# Patient Record
Sex: Male | Born: 1970 | Race: White | Hispanic: No | Marital: Married | Smoking: Current every day smoker
Health system: Southern US, Community
[De-identification: ages and names within clinical notes are randomized; demographics above are authoritative.]

## PROBLEM LIST (undated history)

## (undated) DIAGNOSIS — J039 Acute tonsillitis, unspecified: Secondary | ICD-10-CM

## (undated) DIAGNOSIS — J020 Streptococcal pharyngitis: Secondary | ICD-10-CM

## (undated) DIAGNOSIS — K219 Gastro-esophageal reflux disease without esophagitis: Secondary | ICD-10-CM

## (undated) HISTORY — PX: APPENDECTOMY: SHX54

## (undated) HISTORY — PX: OTHER SURGICAL HISTORY: SHX169

---

## 2012-02-26 ENCOUNTER — Encounter (HOSPITAL_COMMUNITY): Payer: Self-pay

## 2012-02-26 ENCOUNTER — Emergency Department (HOSPITAL_COMMUNITY)
Admission: EM | Admit: 2012-02-26 | Discharge: 2012-02-26 | Disposition: A | Discharge status: Home / Self Care | Payer: Self-pay | Attending: Emergency Medicine | Admitting: Emergency Medicine

## 2012-02-26 DIAGNOSIS — R599 Enlarged lymph nodes, unspecified: Secondary | ICD-10-CM | POA: Insufficient documentation

## 2012-02-26 DIAGNOSIS — R509 Fever, unspecified: Secondary | ICD-10-CM | POA: Insufficient documentation

## 2012-02-26 DIAGNOSIS — J029 Acute pharyngitis, unspecified: Secondary | ICD-10-CM | POA: Insufficient documentation

## 2012-02-26 DIAGNOSIS — F17200 Nicotine dependence, unspecified, uncomplicated: Secondary | ICD-10-CM | POA: Insufficient documentation

## 2012-02-26 DIAGNOSIS — Z8619 Personal history of other infectious and parasitic diseases: Secondary | ICD-10-CM | POA: Insufficient documentation

## 2012-02-26 DIAGNOSIS — R51 Headache: Secondary | ICD-10-CM | POA: Insufficient documentation

## 2012-02-26 DIAGNOSIS — R000 Tachycardia, unspecified: Secondary | ICD-10-CM | POA: Insufficient documentation

## 2012-02-26 MED ORDER — PENICILLIN V POTASSIUM 250 MG/5ML PO SOLR: 250.0000 mg | Freq: Four times a day (QID) | ORAL | Status: AC

## 2012-02-26 MED ORDER — HYDROCODONE-ACETAMINOPHEN 5-325 MG PO TABS: 1.0000 | ORAL_TABLET | ORAL | Status: DC | PRN

## 2012-02-26 MED ORDER — PENICILLIN V POTASSIUM 250 MG PO TABS: 250.0000 mg | ORAL_TABLET | Freq: Four times a day (QID) | ORAL | Status: DC

## 2012-02-26 NOTE — ED Notes (Signed)
Unable to examine throat well, pt has strong gag reflex and when I attempted to get a strep screen, he could not tolerate it .  Says he has always been that way and could not swallow well. Wants liquid meds.

## 2012-02-26 NOTE — ED Notes (Signed)
Pt reports sore throat for 3 days.

## 2012-02-26 NOTE — ED Notes (Signed)
Alert, says he has had a sore throat for 3 days,

## 2012-02-26 NOTE — ED Provider Notes (Signed)
History     CSN: [id number]  Arrival date & time 02/26/12  1514   First MD Initiated Contact with Patient 02/26/12 1530      Chief Complaint  Patient presents with  . Sore Throat   HPI Steve Gardner is a 42 y.o. male who presents to the ED with a sore throat. The sore throat started 3 days ago. He rates his pain as 8/10.  Associated symptoms include fever, chills, gland swelling and just feeling bad. Denies cough, nausea, vomiting or diarrhea. Has had strep before and felt similar. Friend has strep. The history was provided by the patient.   History reviewed. No pertinent past medical history.  Past Surgical History  Procedure Laterality Date  . Appendectomy      No family history on file.  History  Substance Use Topics  . Smoking status: Current Every Day Smoker  . Smokeless tobacco: Not on file  . Alcohol Use: Yes      Review of Systems  Constitutional: Positive for fever and chills.  HENT: Positive for sore throat. Negative for facial swelling.   Eyes: Negative for redness.  Respiratory: Negative for cough.   Cardiovascular: Negative for chest pain and palpitations.  Gastrointestinal: Negative for nausea, vomiting and abdominal pain.  Genitourinary: Negative for frequency.  Neurological: Positive for headaches. Negative for dizziness.  Psychiatric/Behavioral: Negative for confusion and agitation.    Allergies  Review of patient's allergies indicates not on file.  Home Medications  No current outpatient prescriptions on file.  BP 131/81  Pulse 103  Temp(Src) 98.3 F (36.8 C) (Oral)  Resp 20  Ht 6\' 3"  (1.905 m)  Wt 180 lb (81.647 kg)  BMI 22.5 kg/m2  SpO2 100%  Physical Exam  Nursing note and vitals reviewed. Constitutional: He is oriented to person, place, and time. He appears well-developed and well-nourished. No distress.  HENT:  Head: Normocephalic and atraumatic.  Right Ear: Tympanic membrane normal.  Left Ear: Tympanic membrane normal.  Nose:  Nose normal.  Mouth/Throat: Uvula is midline and mucous membranes are normal. Oropharyngeal exudate and posterior oropharyngeal erythema present.  Eyes: EOM are normal. Pupils are equal, round, and reactive to light.  Neck: Neck supple.  Cardiovascular:  tachycardia  Pulmonary/Chest: Effort normal and breath sounds normal.  Abdominal: Soft. There is no tenderness.  Musculoskeletal: Normal range of motion. He exhibits no edema.  Neurological: He is alert and oriented to person, place, and time. No cranial nerve deficit.  Skin: Skin is warm and dry.  Psychiatric: He has a normal mood and affect. His behavior is normal. Judgment and thought content normal.   Procedures  Assessment: 42 y.o. male with pharyngitis  Plan:  Pen VK   Ibuprofen Discussed with the patient and all questioned fully answered. He will return if any problems arise.    Medication List    TAKE these medications       HYDROcodone-acetaminophen 5-325 MG per tablet  Commonly known as:  NORCO/VICODIN  Take 1 tablet by mouth every 4 (four) hours as needed for pain.     penicillin v potassium 250 MG tablet  Commonly known as:  VEETID  Take 1 tablet (250 mg total) by mouth 4 (four) times daily.            [place], [place] 02/26/12 [phone]

## 2012-03-01 NOTE — ED Provider Notes (Signed)
History/physical exam/procedure(s) were performed by non-physician practitioner and as supervising physician I was immediately available for consultation/collaboration. I have reviewed all notes and am in agreement with care and plan.   Hilario Quarry, MD 03/01/12 1556

## 2012-03-11 ENCOUNTER — Encounter (HOSPITAL_COMMUNITY): Payer: Self-pay | Admitting: *Deleted

## 2012-03-11 ENCOUNTER — Emergency Department (HOSPITAL_COMMUNITY)
Admission: EM | Admit: 2012-03-11 | Discharge: 2012-03-11 | Disposition: A | Discharge status: Home / Self Care | Payer: BC Managed Care – PPO | Attending: Emergency Medicine | Admitting: Emergency Medicine

## 2012-03-11 DIAGNOSIS — R51 Headache: Secondary | ICD-10-CM | POA: Insufficient documentation

## 2012-03-11 DIAGNOSIS — R05 Cough: Secondary | ICD-10-CM | POA: Insufficient documentation

## 2012-03-11 DIAGNOSIS — R599 Enlarged lymph nodes, unspecified: Secondary | ICD-10-CM | POA: Insufficient documentation

## 2012-03-11 DIAGNOSIS — J029 Acute pharyngitis, unspecified: Secondary | ICD-10-CM | POA: Insufficient documentation

## 2012-03-11 DIAGNOSIS — F17200 Nicotine dependence, unspecified, uncomplicated: Secondary | ICD-10-CM | POA: Insufficient documentation

## 2012-03-11 DIAGNOSIS — J3489 Other specified disorders of nose and nasal sinuses: Secondary | ICD-10-CM | POA: Insufficient documentation

## 2012-03-11 DIAGNOSIS — R5381 Other malaise: Secondary | ICD-10-CM | POA: Insufficient documentation

## 2012-03-11 DIAGNOSIS — R059 Cough, unspecified: Secondary | ICD-10-CM | POA: Insufficient documentation

## 2012-03-11 DIAGNOSIS — R5383 Other fatigue: Secondary | ICD-10-CM | POA: Insufficient documentation

## 2012-03-11 DIAGNOSIS — R6889 Other general symptoms and signs: Secondary | ICD-10-CM

## 2012-03-11 LAB — MONONUCLEOSIS SCREEN: Mono Screen: NEGATIVE

## 2012-03-11 MED ORDER — PREDNISONE 10 MG PO TABS: 10.0000 mg | ORAL_TABLET | Freq: Every day | ORAL | Status: DC

## 2012-03-11 MED ORDER — KETOROLAC TROMETHAMINE 30 MG/ML IJ SOLN: 30.0000 mg | Freq: Once | INTRAMUSCULAR | Status: DC

## 2012-03-11 MED ORDER — HYDROCODONE-ACETAMINOPHEN 5-325 MG PO TABS: 1.0000 | ORAL_TABLET | ORAL | Status: DC | PRN

## 2012-03-11 MED ORDER — KETOROLAC TROMETHAMINE 60 MG/2ML IM SOLN
30.0000 mg | Freq: Once | INTRAMUSCULAR | Status: AC
  Administered 2012-03-11: 30 mg via INTRAMUSCULAR
  Filled 2012-03-11: qty 2

## 2012-03-11 MED ORDER — AZITHROMYCIN 200 MG/5ML PO SUSR: ORAL | Status: DC

## 2012-03-11 NOTE — ED Notes (Signed)
Pt with sore throat and generalized weakness x 3 weeks, denies fever, has taken Penicillin but stopped it and sore throat came back and then resumed med

## 2012-03-11 NOTE — ED Notes (Signed)
Pt c/o sore throat, dysphagia, cough and generalized weakness x3 weeks. Pt was seen here last week and given abx for tx. Pt still has symptoms and is here for follow-up.

## 2012-03-11 NOTE — ED Provider Notes (Signed)
History     CSN: [id number]  Arrival date & time 03/11/12  1448   First MD Initiated Contact with Patient 03/11/12 [phone]      Chief Complaint  Patient presents with  . Sore Throat  . Weakness   HPI Steve Gardner is a 42 y.o. male who presents to the ED with sore throat. The sore throat started 3 weeks ago. Associated symptoms include generalized weakness. He denies fever. Was taking Penicillin but stopped it and the sore throat came back. Patient states he gets this every year and always has to get liquid prednisone, liquid antibiotic and liquid pain medication.   History reviewed. No pertinent past medical history.  Past Surgical History  Procedure Laterality Date  . Appendectomy      History reviewed. No pertinent family history.  History  Substance Use Topics  . Smoking status: Current Every Day Smoker  . Smokeless tobacco: Not on file  . Alcohol Use: Yes      Review of Systems  Constitutional: Negative for fever and chills.  HENT: Positive for congestion and sore throat. Negative for neck pain.   Respiratory: Positive for cough.   Cardiovascular: Negative for chest pain.  Gastrointestinal: Negative for nausea, vomiting and abdominal pain.  Musculoskeletal: Negative for back pain.  Skin: Negative for rash and wound.  Allergic/Immunologic: Negative for immunocompromised state.  Neurological: Positive for headaches. Negative for dizziness.  Psychiatric/Behavioral: Negative for confusion. The patient is not nervous/anxious.     Allergies  Review of patient's allergies indicates no known allergies.  Home Medications   Current Outpatient Rx  Name  Route  Sig  Dispense  Refill  . HYDROcodone-acetaminophen (NORCO/VICODIN) 5-325 MG per tablet   Oral   Take 1 tablet by mouth every 4 (four) hours as needed for pain.   10 tablet   0     BP 108/84  Pulse 93  Temp(Src) 98.1 F (36.7 C) (Oral)  Resp 18  Ht 6\' 3"  (1.905 m)  Wt 184 lb (83.462 kg)  BMI 23 kg/m2   SpO2 98%  Physical Exam  Nursing note and vitals reviewed. Constitutional: He is oriented to person, place, and time. He appears well-developed and well-nourished. No distress.  HENT:  Head: Normocephalic and atraumatic.  Right Ear: Tympanic membrane normal.  Left Ear: Tympanic membrane normal.  Nose: Nose normal.  Mouth/Throat: Uvula is midline and mucous membranes are normal. Posterior oropharyngeal erythema present.  Eyes: EOM are normal. Pupils are equal, round, and reactive to light.  Neck: Neck supple.  Cardiovascular: Normal rate and regular rhythm.   Pulmonary/Chest: Effort normal and breath sounds normal.  Abdominal: Soft. There is no tenderness.  Musculoskeletal: Normal range of motion. He exhibits no edema.  Lymphadenopathy:    He has cervical adenopathy.  Neurological: He is alert and oriented to person, place, and time. No cranial nerve deficit.  Skin: Skin is warm and dry.  Psychiatric: He has a normal mood and affect. His behavior is normal. Judgment and thought content normal.   Procedures  Assessment: 42 y.o. male with sore throat Flu like symptoms  Plan: Zithromax  Prednisone Discussed with the patient and all questioned fully answered.    Medication List    TAKE these medications       azithromycin 200 MG/5ML suspension  Commonly known as:  ZITHROMAX  Take 2.5 tsps. By mouth initially and then take 1.5 tsps. Daily x 4 days     HYDROcodone-acetaminophen 5-325 MG per tablet  Commonly  known as:  NORCO/VICODIN  Take 1 tablet by mouth every 4 (four) hours as needed for pain.     predniSONE 10 MG tablet  Commonly known as:  DELTASONE  Take 1 tablet (10 mg total) by mouth daily.      ASK your doctor about these medications       COUGH/COLD MEDICINE PO  Take 15-30 mLs by mouth daily as needed (for symptoms).          Janne Napoleon, NP 03/11/12 [phone]

## 2012-03-11 NOTE — ED Provider Notes (Signed)
Medical screening examination/treatment/procedure(s) were performed by non-physician practitioner and as supervising physician I was immediately available for consultation/collaboration.   Joya Gaskins, MD 03/11/12 [phone]

## 2012-03-31 ENCOUNTER — Emergency Department (HOSPITAL_COMMUNITY): Payer: BC Managed Care – PPO

## 2012-03-31 ENCOUNTER — Emergency Department (HOSPITAL_COMMUNITY)
Admission: EM | Admit: 2012-03-31 | Discharge: 2012-03-31 | Disposition: A | Discharge status: Home / Self Care | Payer: BC Managed Care – PPO | Attending: Emergency Medicine | Admitting: Emergency Medicine

## 2012-03-31 ENCOUNTER — Encounter (HOSPITAL_COMMUNITY): Payer: Self-pay

## 2012-03-31 DIAGNOSIS — R1310 Dysphagia, unspecified: Secondary | ICD-10-CM | POA: Insufficient documentation

## 2012-03-31 DIAGNOSIS — Z8709 Personal history of other diseases of the respiratory system: Secondary | ICD-10-CM | POA: Insufficient documentation

## 2012-03-31 DIAGNOSIS — R6889 Other general symptoms and signs: Secondary | ICD-10-CM | POA: Insufficient documentation

## 2012-03-31 DIAGNOSIS — F1010 Alcohol abuse, uncomplicated: Secondary | ICD-10-CM | POA: Insufficient documentation

## 2012-03-31 DIAGNOSIS — R05 Cough: Secondary | ICD-10-CM | POA: Insufficient documentation

## 2012-03-31 DIAGNOSIS — R059 Cough, unspecified: Secondary | ICD-10-CM | POA: Insufficient documentation

## 2012-03-31 DIAGNOSIS — F17200 Nicotine dependence, unspecified, uncomplicated: Secondary | ICD-10-CM | POA: Insufficient documentation

## 2012-03-31 DIAGNOSIS — J029 Acute pharyngitis, unspecified: Secondary | ICD-10-CM | POA: Insufficient documentation

## 2012-03-31 HISTORY — DX: Acute tonsillitis, unspecified: J03.90

## 2012-03-31 HISTORY — DX: Streptococcal pharyngitis: J02.0

## 2012-03-31 LAB — CBC WITH DIFFERENTIAL/PLATELET
Basophils Absolute: 0 10*3/uL (ref 0.0–0.1)
Basophils Relative: 0 % (ref 0–1)
Hemoglobin: 16.4 g/dL (ref 13.0–17.0)
MCHC: 35 g/dL (ref 30.0–36.0)
Neutro Abs: 8.6 10*3/uL — ABNORMAL HIGH (ref 1.7–7.7)
Neutrophils Relative %: 73 % (ref 43–77)
Platelets: 213 10*3/uL (ref 150–400)
RDW: 13.4 % (ref 11.5–15.5)

## 2012-03-31 LAB — BASIC METABOLIC PANEL
Chloride: 95 mEq/L — ABNORMAL LOW (ref 96–112)
GFR calc Af Amer: >90 mL/min (ref >90)
Potassium: 4 mEq/L (ref 3.5–5.1)

## 2012-03-31 MED ORDER — HYDROCODONE-ACETAMINOPHEN 7.5-325 MG/15ML PO SOLN
ORAL | Status: AC
  Administered 2012-03-31: 10 mL via ORAL
  Filled 2012-03-31: qty 15

## 2012-03-31 MED ORDER — LORAZEPAM 2 MG/ML IJ SOLN: 1.0000 mg | Freq: Once | INTRAMUSCULAR | Status: DC

## 2012-03-31 MED ORDER — LORAZEPAM 1 MG PO TABS
1.0000 mg | ORAL_TABLET | Freq: Once | ORAL | Status: AC
  Administered 2012-03-31: 1 mg via ORAL
  Filled 2012-03-31: qty 1

## 2012-03-31 MED ORDER — HYDROCODONE-ACETAMINOPHEN 7.5-500 MG/15ML PO SOLN
10.0000 mL | Freq: Four times a day (QID) | ORAL | Status: DC | PRN
Start: 2012-03-31 — End: 2012-07-15

## 2012-03-31 NOTE — ED Notes (Signed)
Patient complaining of sore throat. Has a history of tonsillitis and strep per family. Has been here several times during the past few weeks for the same.

## 2012-03-31 NOTE — ED Notes (Signed)
Sore throat for over 1 week.  Seen here for same.  Alert, Says he cannot swallow pills,  But has drank 2 beers today.

## 2012-04-01 ENCOUNTER — Ambulatory Visit (HOSPITAL_COMMUNITY)
Admission: RE | Admit: 2012-04-01 | Discharge: 2012-04-01 | Disposition: A | Discharge status: Home / Self Care | Payer: BC Managed Care – PPO | Source: Ambulatory Visit | Attending: Emergency Medicine | Admitting: Emergency Medicine

## 2012-04-01 ENCOUNTER — Ambulatory Visit (HOSPITAL_COMMUNITY): Payer: Self-pay

## 2012-04-01 DIAGNOSIS — J029 Acute pharyngitis, unspecified: Secondary | ICD-10-CM | POA: Insufficient documentation

## 2012-04-01 DIAGNOSIS — R220 Localized swelling, mass and lump, head: Secondary | ICD-10-CM | POA: Insufficient documentation

## 2012-04-01 MED ORDER — IOHEXOL 300 MG/ML  SOLN
75.0000 mL | Freq: Once | INTRAMUSCULAR | Status: AC | PRN
  Administered 2012-04-01: 75 mL via INTRAVENOUS

## 2012-04-03 NOTE — ED Provider Notes (Signed)
Medical screening examination/treatment/procedure(s) were performed by non-physician practitioner and as supervising physician I was immediately available for consultation/collaboration.   Shelda Jakes, MD 04/03/12 [phone]

## 2012-04-03 NOTE — ED Provider Notes (Signed)
History     CSN: [id number]  Arrival date & time 03/31/12  1924   First MD Initiated Contact with Patient 03/31/12 1937      Chief Complaint  Patient presents with  . Sore Throat    (Consider location/radiation/quality/duration/timing/severity/associated sxs/prior treatment) HPI Comments: Steve Gardner is a 42 y.o. Male presenting with a now more than 1 month history of sore throat and sensation of foreign body in his lower throat (points to his cricoid area).  This is his third visit here since 02/26/12 for this complaint and has had no relief despite 2 rounds of antibiotics including amoxil and zithromax.  The soreness and swelling has worsened to where he cannot swallow pills. He is able to eat and drink although has been careful to eat only soft foods.  His wife has noticed he constantly clears his throat.  He has had some mild cough, but states not worsened then his normal smokers cough.  He denies wheezing, shortness of breath and has had no fevers or chills.  Additionally denies nasal congestion or post nasal drip. He does drink etoh most days according to wife, with him replying it is to stop the pain in his throat.     The history is provided by the patient and the spouse.    Past Medical History  Diagnosis Date  . Strep throat   . Tonsillitis     Past Surgical History  Procedure Laterality Date  . Appendectomy      No family history on file.  History  Substance Use Topics  . Smoking status: Current Every Day Smoker  . Smokeless tobacco: Not on file  . Alcohol Use: Yes      Review of Systems  Constitutional: Negative for fever and chills.  HENT: Positive for sore throat and trouble swallowing. Negative for nosebleeds, congestion, rhinorrhea, neck pain, voice change, postnasal drip and sinus pressure.   Eyes: Negative.   Respiratory: Positive for cough. Negative for choking, chest tightness, shortness of breath, wheezing and stridor.   Cardiovascular: Negative  for chest pain.  Gastrointestinal: Negative for nausea and abdominal pain.  Genitourinary: Negative.   Musculoskeletal: Negative for joint swelling and arthralgias.  Skin: Negative.  Negative for rash and wound.  Neurological: Negative for dizziness, weakness, light-headedness, numbness and headaches.  Psychiatric/Behavioral: Negative.     Allergies  Review of patient's allergies indicates no known allergies.  Home Medications   Current Outpatient Rx  Name  Route  Sig  Dispense  Refill  . HYDROcodone-acetaminophen (LORTAB) 7.5-500 MG/15ML solution   Oral   Take 10 mLs by mouth every 6 (six) hours as needed for pain.   120 mL   0     BP 130/80  Pulse 94  Temp(Src) 98.1 F (36.7 C) (Oral)  Resp 18  Ht 6\' 3"  (1.905 m)  Wt 175 lb (79.379 kg)  BMI 21.87 kg/m2  SpO2 98%  Physical Exam  Nursing note and vitals reviewed. Constitutional: He appears well-developed and well-nourished.  HENT:  Head: Normocephalic and atraumatic.  Eyes: Conjunctivae are normal.  Neck: Normal range of motion.  Cardiovascular: Normal rate, regular rhythm, normal heart sounds and intact distal pulses.   Pulmonary/Chest: Effort normal and breath sounds normal. No respiratory distress. He has no decreased breath sounds. He has no wheezes. He has no rhonchi.  Abdominal: Soft. Bowel sounds are normal. There is no tenderness.  Musculoskeletal: Normal range of motion.  Neurological: He is alert.  Skin: Skin is warm and  dry.  Psychiatric: He has a normal mood and affect.    ED Course  Procedures (including critical care time)  Labs Reviewed  CBC WITH DIFFERENTIAL - Abnormal; Notable for the following:    WBC 11.8 (*)    Neutro Abs 8.6 (*)    All other components within normal limits  BASIC METABOLIC PANEL - Abnormal; Notable for the following:    Sodium 133 (*)    Chloride 95 (*)    All other components within normal limits   Ct Soft Tissue Neck W Contrast  04/01/2012  *RADIOLOGY REPORT*   Clinical Data: Pain and swelling.  Sore throat.  CT NECK WITH CONTRAST  Technique:  Multidetector CT imaging of the neck was performed with intravenous contrast.  Contrast: 75mL OMNIPAQUE IOHEXOL 300 MG/ML  SOLN  Comparison: None.  Findings: The visualized brainstem and cerebellum are grossly normal.  The skull base is normal.  Scattered ethmoid sinus disease and small mucous retention cysts or polyps in the maxillary sinuses.  The mastoid air cells and middle ear cavities are clear.  The parotid and submandibular glands are symmetric and normal.  No inflammatory changes or mass lesions.  The tongue base and floor the mouth are normal.  The epiglottis is normal and the aryepiglottic folds are normal.  Slightly prominent lymphoid tissue at the base the tongue and mildly prominent tonsils but no findings for significant tonsillar inflammation/edema and no abscess.  The piriform sinus and vallecular air spaces are normal.  The paraglottic fat planes are maintained.  The thyroid gland is normal.  The major vascular structures are normal.  There are small scattered neck nodes bilaterally but no mass or adenopathy.  The upper esophagus is unremarkable.  No supraclavicular or superior mediastinal lymphadenopathy.  The lung apices are clear.  IMPRESSION: Unremarkable CT examination of the neck.  No findings for significant inflammatory process, abscess, mass or adenopathy.   Original Report Authenticated By: Rudie Meyer, M.D.      1. Pharyngitis       MDM  Pt with third visit here with complaint of fb or swelling sensation in throat without improvement with abx.  During the visit,  CT was not available,  So plain film soft tissue neck completed with subglottic narrowing  But patent airway. Pt was scheduled for outpatient CT neck to further define sx and nonspecific finding on plain film  He was given lortab elixer in ed which helped his sx.  He was given prescription for this.     03/31/12 - pt's Ct results  were reviewed with him and reassurance given with normal findings.  He was encouraged to stop smoking.  Encouraged to f/u with pcp if sx do not resolve.  Also suggested warm tea,  Throat lozenges, etc.      Burgess Amor, PA-C 04/03/12 0121

## 2012-07-15 ENCOUNTER — Emergency Department (HOSPITAL_COMMUNITY)
Admission: EM | Admit: 2012-07-15 | Discharge: 2012-07-15 | Disposition: A | Discharge status: Home / Self Care | Payer: BC Managed Care – PPO | Attending: Emergency Medicine | Admitting: Emergency Medicine

## 2012-07-15 ENCOUNTER — Encounter (HOSPITAL_COMMUNITY): Payer: Self-pay

## 2012-07-15 DIAGNOSIS — K219 Gastro-esophageal reflux disease without esophagitis: Secondary | ICD-10-CM | POA: Insufficient documentation

## 2012-07-15 DIAGNOSIS — Z8619 Personal history of other infectious and parasitic diseases: Secondary | ICD-10-CM | POA: Insufficient documentation

## 2012-07-15 DIAGNOSIS — F17200 Nicotine dependence, unspecified, uncomplicated: Secondary | ICD-10-CM | POA: Insufficient documentation

## 2012-07-15 DIAGNOSIS — M79644 Pain in right finger(s): Secondary | ICD-10-CM

## 2012-07-15 DIAGNOSIS — R609 Edema, unspecified: Secondary | ICD-10-CM | POA: Insufficient documentation

## 2012-07-15 DIAGNOSIS — Z8709 Personal history of other diseases of the respiratory system: Secondary | ICD-10-CM | POA: Insufficient documentation

## 2012-07-15 DIAGNOSIS — M79609 Pain in unspecified limb: Secondary | ICD-10-CM | POA: Insufficient documentation

## 2012-07-15 DIAGNOSIS — Z79899 Other long term (current) drug therapy: Secondary | ICD-10-CM | POA: Insufficient documentation

## 2012-07-15 HISTORY — DX: Gastro-esophageal reflux disease without esophagitis: K21.9

## 2012-07-15 MED ORDER — HYDROCODONE-ACETAMINOPHEN 5-325 MG PO TABS
1.0000 | ORAL_TABLET | Freq: Once | ORAL | Status: AC
  Administered 2012-07-15: 1 via ORAL
  Filled 2012-07-15: qty 1

## 2012-07-15 MED ORDER — HYDROCODONE-ACETAMINOPHEN 5-325 MG PO TABS: ORAL_TABLET | ORAL | Status: DC

## 2012-07-15 NOTE — ED Notes (Signed)
Instructions, prescriptions and f/u information given/reviewed - verbalizes understanding.  

## 2012-07-15 NOTE — ED Notes (Signed)
Old injury, (2009), has recurrent swelling on thumb pad. "needs draining" per pt.

## 2012-07-15 NOTE — ED Provider Notes (Signed)
History    CSN: [id number] Arrival date & time 07/15/12  1900  First MD Initiated Contact with Patient 07/15/12 2112     Chief Complaint  Patient presents with  . Finger Injury   (Consider location/radiation/quality/duration/timing/severity/associated sxs/prior Treatment) HPI Comments: Steve Gardner is a 42 y.o. male who presents to the Emergency Department complaining of pain and swelling to the distal right thumb.  States that he had a tendon injury to his thumb in 2009 and had a swollen area develop to the distal thumb that was drained.  Comes to the ED requesting pain control until his orthopedic appt in two days.  He states sx's are same as previous and this is a re-occurring problem.  He denies redness , pain or numbness of the distal thumb.    Patient is a 42 y.o. male presenting with hand pain. The history is provided by the patient.  Hand Pain This is a recurrent problem. The current episode started 1 to 4 weeks ago. The problem occurs constantly. The problem has been gradually worsening. Associated symptoms include arthralgias and joint swelling. Pertinent negatives include no chest pain, chills, fever, myalgias, neck pain, numbness, rash, swollen glands or vomiting. Exacerbated by: movement and plaption. He has tried nothing for the symptoms. The treatment provided no relief.   Past Medical History  Diagnosis Date  . Strep throat   . Tonsillitis   . GERD (gastroesophageal reflux disease)     has "lpr"   Past Surgical History  Procedure Laterality Date  . Appendectomy     History reviewed. No pertinent family history. History  Substance Use Topics  . Smoking status: Current Every Day Smoker -- 0.50 packs/day  . Smokeless tobacco: Not on file  . Alcohol Use: Yes    Review of Systems  Constitutional: Negative for fever and chills.  HENT: Negative for neck pain.   Cardiovascular: Negative for chest pain.  Gastrointestinal: Negative for vomiting.  Genitourinary:  Negative for dysuria and difficulty urinating.  Musculoskeletal: Positive for joint swelling and arthralgias. Negative for myalgias.  Skin: Negative for color change, rash and wound.  Neurological: Negative for numbness.  All other systems reviewed and are negative.    Allergies  Review of patient's allergies indicates no known allergies.  Home Medications   Current Outpatient Rx  Name  Route  Sig  Dispense  Refill  . esomeprazole (NEXIUM) 40 MG capsule   Oral   Take 40 mg by mouth daily before breakfast.          BP 111/76  Pulse 72  Temp(Src) 97.7 F (36.5 C) (Oral)  Resp 18  Ht 6\' 2"  (1.88 m)  Wt 190 lb (86.183 kg)  BMI 24.38 kg/m2  SpO2 100% Physical Exam  Nursing note and vitals reviewed. Constitutional: He is oriented to person, place, and time. He appears well-developed and well-nourished. No distress.  HENT:  Head: Normocephalic and atraumatic.  Cardiovascular: Normal rate, regular rhythm and normal heart sounds.   No murmur heard. Pulmonary/Chest: Effort normal and breath sounds normal. No respiratory distress.  Musculoskeletal: He exhibits edema and tenderness.       Right hand: He exhibits tenderness and swelling. He exhibits normal range of motion, no bony tenderness, normal two-point discrimination, normal capillary refill, no deformity and no laceration. Normal sensation noted. Normal strength noted.       Hands: Palpable flesh colored nodule to the distal right thumb.  No erythema, excessive warmth,  Distal sensation intact,  Radial pulse  brisk.  No felon present.  Nail appears nml  Neurological: He is alert and oriented to person, place, and time. He exhibits normal muscle tone. Coordination normal.  Skin: Skin is warm. There is erythema.    ED Course  Procedures (including critical care time) Labs Reviewed - No data to display   MDM    Patient has  Palpable mass to the distal right thumb just above the DIP joint.  No erythema, severe tenderness,  excessive warmth or swelling of the thumb pad.  No felon.  Hx of same.  Likely ganglion vs seroma.  Pt has appt with orthopedist in 2 days.  requesting pain control until then  Steve Gardner L. Trisha Mangle, PA-C 07/18/12 2059

## 2012-07-19 NOTE — ED Provider Notes (Signed)
Medical screening examination/treatment/procedure(s) were performed by non-physician practitioner and as supervising physician I was immediately available for consultation/collaboration.   Johanan Skorupski L Honi Name, MD 07/19/12 1532 

## 2012-07-22 ENCOUNTER — Other Ambulatory Visit: Payer: Self-pay | Admitting: Radiology

## 2012-08-08 ENCOUNTER — Encounter (HOSPITAL_COMMUNITY): Payer: Self-pay

## 2012-08-12 NOTE — Patient Instructions (Addendum)
Steve Gardner  08/12/2012   Your procedure is scheduled on:   08/18/2012   Report to [facility] at   615  AM.  Call this number if you have problems the morning of surgery: [phone]   Remember:   Do not eat food or drink liquids after midnight.   Take these medicines the morning of surgery with A SIP OF WATER:  Norco, nexium   Do not wear jewelry, make-up or nail polish.  Do not wear lotions, powders, or perfumes.   Do not shave 48 hours prior to surgery. Men may shave face and neck.  Do not bring valuables to the hospital.  [facility] is not responsible for any belongings or valuables.  Contacts, dentures or bridgework may not be worn into surgery.  Leave suitcase in the car. After surgery it may be brought to your room.  For patients admitted to the hospital, checkout time is 11:00 AM the day of discharge.   Patients discharged the day of surgery will not be allowed to drive home.  Name and phone number of your driver: family  Special Instructions: Shower using CHG 2 nights before surgery and the night before surgery.  If you shower the day of surgery use CHG.  Use special wash - you have one bottle of CHG for all showers.  You should use approximately 1/3 of the bottle for each shower.   Please read over the following fact sheets that you were given: Pain Booklet, Coughing and Deep Breathing, Surgical Site Infection Prevention, Anesthesia Post-op Instructions and Care and Recovery After Surgery Excision of Skin Lesions Excision of a skin lesion refers to the removal of a section of skin by making small cuts (incisions) in the skin. This is typically done to remove a cancerous growth (basal cell carcinoma, squamous cell carcinoma, or melanoma) or a noncancerous growth (cyst). It may be done to treat or prevent cancer or infection. It may also be done to improve cosmetic appearance (removal of mole, skin tag). LET YOUR CAREGIVER KNOW ABOUT:   Allergies to food or  medicine.  Medicines taken, including vitamins, herbs, eyedrops, over-the-counter medicines, and creams.  Use of steroids (by mouth or creams).  Previous problems with anesthetics or numbing medicines.  History of bleeding problems or blood clots.  History of any prostheses.  Previous surgery.  Other health problems, including diabetes and kidney problems.  Possibility of pregnancy, if this applies. RISKS AND COMPLICATIONS  Many complications can be managed. With appropriate treatment and rehabilitation, the following complications are very uncommon:  Bleeding.  Infection.  Scarring.  Recurrence of cyst or cancer.  Changes in skin sensation or appearance (discoloration, swelling).  Reaction to anesthesia.  Allergic reaction to surgical materials or ointments.  Damage to nerves, blood vessels, muscles, or other structures.  Continued pain. BEFORE THE PROCEDURE  It is important to follow your caregiver's instructions prior to your procedure to avoid complications. Steps before your procedure may include:  Physical exam, blood tests, other procedures, such as removing a small sample for examination under a microscope (biopsy).  Your caregiver may review the procedure, the anesthesia being used, and what to expect after the procedure with you. You may be asked to:  Stop taking certain medicines, such as blood thinners (including aspirin, clopidogrel, ibuprofen), for several days prior to your procedure.  Take certain medicines.  Stop smoking. It is a good idea to arrange for a ride home after surgery and to have  someone to help you with activities during recovery. PROCEDURE  There are several excision techniques. The type of excision or surgical technique used will depend on your condition, the location of the lesion, and your overall health. After the lesion is sterilized and a local anesthetic is applied, the following may be performed: Complete surgical  excision The area to be removed is marked with a pen. Using a small scalpel and scissors, the surgeon gently cuts around and under the lesion until it is completely removed. The lesion is placed in a special fluid and sent to the lab for examination. If necessary, bleeding will be controlled with a device that delivers heat. The edges of the wound are stitched together and a dressing is applied. This procedure may be performed to treat a cancerous growth or noncancerous cyst or lesion. Surgeons commonly perform an elliptical excision, to minimize scarring. Excision of a cyst The surgeon makes an incision on the cyst. The entire cyst is removed through the incision. The wound may be closed with a suture (stitch). Shave excision During shave excision, the surgeon uses a small blade or loop instrument to shave off the lesion. This may be done to remove a mole or skin tag. The wound is usually left to heal on its own without stitches. Punch excision During punch excision, the surgeon uses a small, round tool (like a cookie cutter) to cut a circle shape out of the skin. The outer edges of the skin are stitched together. This may be done to remove a mole or scar or to perform a biopsy of the lesion. Mohs micrographic surgery During Mohs micrographic surgery, layers of the lesion are removed with a scalpel or loop instrument and immediately examined under a microscope until all of the abnormal or cancerous tissue is removed. This procedure is minimally invasive and ensures the best cosmetic outcome, with removal of as little normal tissue as possible. Mohs is usually done to treat skin cancer, such as basal cell carcinoma or squamous cell carcinoma, particularly on the face and ears. Antibiotic ointment is applied to the surgical area after each of the procedures listed above, as necessary. AFTER THE PROCEDURE  How well you heal depends on many factors. Most patients heal quite well with proper techniques and  self-care. Scarring will lessen over time. HOME CARE INSTRUCTIONS   Take medicines for pain as directed.  Keep the incision area clean, dry, and protected for at least 48 hours. Change dressings as directed.  For bleeding, apply gentle but firm pressure to the wound using a folded towel for 20 minutes. Call your caregiver if bleeding does not stop.  Avoid high-impact exercise and activities until the stitches are removed or the area heals.  Follow your caregiver's instructions to minimize scarring. Avoid sun exposure until the area has healed. Scarring should lessen over time.  Follow up with your caregiver as directed. Removal of stitches within 4 to 14 days may be necessary. Finding out the results of your test Not all test results are available during your visit. If your test results are not back during the visit, make an appointment with your caregiver to find out the results. Do not assume everything is normal if you have not heard from your caregiver or the medical facility. It is important for you to follow up on all of your test results. SEEK MEDICAL CARE IF:   You or your child has an oral temperature above 102 F (38.9 C).  You develop signs of  infection (chills, feeling unwell).  You notice bleeding, pain, discharge, redness, or swelling at the incision site.  You notice skin irregularities or changes in sensation. MAKE SURE YOU:   Understand these instructions.  Will watch your condition.  Will get help right away if you are not doing well or get worse. FOR MORE INFORMATION  American Academy of Family Physicians: www.https://powers.com/ American Academy of Dermatology: InfoExam.si Document Released: 03/21/2009 Document Revised: 03/19/2011 Document Reviewed: 03/21/2009 [facility] Patient Information 2014 [place], [place]. PATIENT INSTRUCTIONS POST-ANESTHESIA  IMMEDIATELY FOLLOWING SURGERY:  Do not drive or operate machinery for the first twenty four hours after surgery.  Do not  make any important decisions for twenty four hours after surgery or while taking narcotic pain medications or sedatives.  If you develop intractable nausea and vomiting or a severe headache please notify your doctor immediately.  FOLLOW-UP:  Please make an appointment with your surgeon as instructed. You do not need to follow up with anesthesia unless specifically instructed to do so.  WOUND CARE INSTRUCTIONS (if applicable):  Keep a dry clean dressing on the anesthesia/puncture wound site if there is drainage.  Once the wound has quit draining you may leave it open to air.  Generally you should leave the bandage intact for twenty four hours unless there is drainage.  If the epidural site drains for more than 36-48 hours please call the anesthesia department.  QUESTIONS?:  Please feel free to call your physician or the hospital operator if you have any questions, and they will be happy to assist you.

## 2012-08-13 NOTE — Patient Instructions (Signed)
Steve Gardner  08/13/2012   Your procedure is scheduled on:  08/19/12  Report to [facility] at [place] AM.  Call this number if you have problems the morning of surgery: [phone]   Remember:   Do not eat food or drink liquids after midnight.   Take these medicines the morning of surgery with A SIP OF WATER: vicodin, nexium   Do not wear jewelry, make-up or nail polish.  Do not wear lotions, powders, or perfumes. You may wear deodorant.  Do not shave 48 hours prior to surgery. Men may shave face and neck.  Do not bring valuables to the hospital.  [facility] is not responsible                   for any belongings or valuables.  Contacts, dentures or bridgework may not be worn into surgery.  Leave suitcase in the car. After surgery it may be brought to your room.  For patients admitted to the hospital, checkout time is 11:00 AM the day of  discharge.   Patients discharged the day of surgery will not be allowed to drive  home.  Name and phone number of your driver: family  Special Instructions: Shower using CHG 2 nights before surgery and the night before surgery.  If you shower the day of surgery use CHG.  Use special wash - you have one bottle of CHG for all showers.  You should use approximately 1/3 of the bottle for each shower.   Please read over the following fact sheets that you were given: Pain Booklet, Anesthesia Post-op Instructions and Care and Recovery After Surgery   PATIENT INSTRUCTIONS POST-ANESTHESIA  IMMEDIATELY FOLLOWING SURGERY:  Do not drive or operate machinery for the first twenty four hours after surgery.  Do not make any important decisions for twenty four hours after surgery or while taking narcotic pain medications or sedatives.  If you develop intractable nausea and vomiting or a severe headache please notify your doctor immediately.  FOLLOW-UP:  Please make an appointment with your surgeon as instructed. You do not need to follow up with anesthesia unless  specifically instructed to do so.  WOUND CARE INSTRUCTIONS (if applicable):  Keep a dry clean dressing on the anesthesia/puncture wound site if there is drainage.  Once the wound has quit draining you may leave it open to air.  Generally you should leave the bandage intact for twenty four hours unless there is drainage.  If the epidural site drains for more than 36-48 hours please call the anesthesia department.  QUESTIONS?:  Please feel free to call your physician or the hospital operator if you have any questions, and they will be happy to assist you.      Excision of Skin Lesions Excision of a skin lesion refers to the removal of a section of skin by making small cuts (incisions) in the skin. This is typically done to remove a cancerous growth (basal cell carcinoma, squamous cell carcinoma, or melanoma) or a noncancerous growth (cyst). It may be done to treat or prevent cancer or infection. It may also be done to improve cosmetic appearance (removal of mole, skin tag). LET YOUR CAREGIVER KNOW ABOUT:   Allergies to food or medicine.  Medicines taken, including vitamins, herbs, eyedrops, over-the-counter medicines, and creams.  Use of steroids (by mouth or creams).  Previous problems with anesthetics or numbing medicines.  History of bleeding problems or blood clots.  History of any prostheses.  Previous surgery.  Other health problems, including diabetes and kidney problems.  Possibility of pregnancy, if this applies. RISKS AND COMPLICATIONS  Many complications can be managed. With appropriate treatment and rehabilitation, the following complications are very uncommon:  Bleeding.  Infection.  Scarring.  Recurrence of cyst or cancer.  Changes in skin sensation or appearance (discoloration, swelling).  Reaction to anesthesia.  Allergic reaction to surgical materials or ointments.  Damage to nerves, blood vessels, muscles, or other structures.  Continued pain. BEFORE  THE PROCEDURE  It is important to follow your caregiver's instructions prior to your procedure to avoid complications. Steps before your procedure may include:  Physical exam, blood tests, other procedures, such as removing a small sample for examination under a microscope (biopsy).  Your caregiver may review the procedure, the anesthesia being used, and what to expect after the procedure with you. You may be asked to:  Stop taking certain medicines, such as blood thinners (including aspirin, clopidogrel, ibuprofen), for several days prior to your procedure.  Take certain medicines.  Stop smoking. It is a good idea to arrange for a ride home after surgery and to have someone to help you with activities during recovery. PROCEDURE  There are several excision techniques. The type of excision or surgical technique used will depend on your condition, the location of the lesion, and your overall health. After the lesion is sterilized and a local anesthetic is applied, the following may be performed: Complete surgical excision The area to be removed is marked with a pen. Using a small scalpel and scissors, the surgeon gently cuts around and under the lesion until it is completely removed. The lesion is placed in a special fluid and sent to the lab for examination. If necessary, bleeding will be controlled with a device that delivers heat. The edges of the wound are stitched together and a dressing is applied. This procedure may be performed to treat a cancerous growth or noncancerous cyst or lesion. Surgeons commonly perform an elliptical excision, to minimize scarring. Excision of a cyst The surgeon makes an incision on the cyst. The entire cyst is removed through the incision. The wound may be closed with a suture (stitch). Shave excision During shave excision, the surgeon uses a small blade or loop instrument to shave off the lesion. This may be done to remove a mole or skin tag. The wound is  usually left to heal on its own without stitches. Punch excision During punch excision, the surgeon uses a small, round tool (like a cookie cutter) to cut a circle shape out of the skin. The outer edges of the skin are stitched together. This may be done to remove a mole or scar or to perform a biopsy of the lesion. Mohs micrographic surgery During Mohs micrographic surgery, layers of the lesion are removed with a scalpel or loop instrument and immediately examined under a microscope until all of the abnormal or cancerous tissue is removed. This procedure is minimally invasive and ensures the best cosmetic outcome, with removal of as little normal tissue as possible. Mohs is usually done to treat skin cancer, such as basal cell carcinoma or squamous cell carcinoma, particularly on the face and ears. Antibiotic ointment is applied to the surgical area after each of the procedures listed above, as necessary. AFTER THE PROCEDURE  How well you heal depends on many factors. Most patients heal quite well with proper techniques and self-care. Scarring will lessen over time. HOME CARE INSTRUCTIONS   Take medicines for pain as  directed.  Keep the incision area clean, dry, and protected for at least 48 hours. Change dressings as directed.  For bleeding, apply gentle but firm pressure to the wound using a folded towel for 20 minutes. Call your caregiver if bleeding does not stop.  Avoid high-impact exercise and activities until the stitches are removed or the area heals.  Follow your caregiver's instructions to minimize scarring. Avoid sun exposure until the area has healed. Scarring should lessen over time.  Follow up with your caregiver as directed. Removal of stitches within 4 to 14 days may be necessary. Finding out the results of your test Not all test results are available during your visit. If your test results are not back during the visit, make an appointment with your caregiver to find out the  results. Do not assume everything is normal if you have not heard from your caregiver or the medical facility. It is important for you to follow up on all of your test results. SEEK MEDICAL CARE IF:   You or your child has an oral temperature above 102 F (38.9 C).  You develop signs of infection (chills, feeling unwell).  You notice bleeding, pain, discharge, redness, or swelling at the incision site.  You notice skin irregularities or changes in sensation. MAKE SURE YOU:   Understand these instructions.  Will watch your condition.  Will get help right away if you are not doing well or get worse. FOR MORE INFORMATION  American Academy of Family Physicians: www.https://powers.com/ American Academy of Dermatology: InfoExam.si Document Released: 03/21/2009 Document Revised: 03/19/2011 Document Reviewed: 03/21/2009 [facility] Patient Information 2014 [place], [place].

## 2012-08-14 ENCOUNTER — Encounter (HOSPITAL_COMMUNITY)
Admission: RE | Admit: 2012-08-14 | Discharge: 2012-08-14 | Disposition: A | Discharge status: Home / Self Care | Payer: BC Managed Care – PPO | Source: Ambulatory Visit | Attending: Orthopaedic Surgery | Admitting: Orthopaedic Surgery

## 2012-08-14 ENCOUNTER — Emergency Department (HOSPITAL_COMMUNITY)
Admission: EM | Admit: 2012-08-14 | Discharge: 2012-08-14 | Disposition: A | Discharge status: Home / Self Care | Payer: BC Managed Care – PPO | Attending: Emergency Medicine | Admitting: Emergency Medicine

## 2012-08-14 ENCOUNTER — Encounter (HOSPITAL_COMMUNITY): Payer: Self-pay

## 2012-08-14 ENCOUNTER — Encounter (HOSPITAL_COMMUNITY): Payer: Self-pay | Admitting: Emergency Medicine

## 2012-08-14 DIAGNOSIS — L989 Disorder of the skin and subcutaneous tissue, unspecified: Secondary | ICD-10-CM | POA: Insufficient documentation

## 2012-08-14 DIAGNOSIS — Z8709 Personal history of other diseases of the respiratory system: Secondary | ICD-10-CM | POA: Insufficient documentation

## 2012-08-14 DIAGNOSIS — K219 Gastro-esophageal reflux disease without esophagitis: Secondary | ICD-10-CM | POA: Insufficient documentation

## 2012-08-14 DIAGNOSIS — Z01812 Encounter for preprocedural laboratory examination: Secondary | ICD-10-CM | POA: Insufficient documentation

## 2012-08-14 DIAGNOSIS — M25549 Pain in joints of unspecified hand: Secondary | ICD-10-CM | POA: Insufficient documentation

## 2012-08-14 DIAGNOSIS — M6740 Ganglion, unspecified site: Secondary | ICD-10-CM

## 2012-08-14 DIAGNOSIS — Z79899 Other long term (current) drug therapy: Secondary | ICD-10-CM | POA: Insufficient documentation

## 2012-08-14 DIAGNOSIS — F17200 Nicotine dependence, unspecified, uncomplicated: Secondary | ICD-10-CM | POA: Insufficient documentation

## 2012-08-14 LAB — URINALYSIS, ROUTINE W REFLEX MICROSCOPIC
Bilirubin Urine: NEGATIVE
Leukocytes, UA: NEGATIVE
Nitrite: NEGATIVE
Specific Gravity, Urine: >1.03 — ABNORMAL HIGH (ref 1.005–1.030)
Urobilinogen, UA: 0.2 mg/dL (ref 0.0–1.0)
pH: 6 (ref 5.0–8.0)

## 2012-08-14 LAB — COMPREHENSIVE METABOLIC PANEL
ALT: 35 U/L (ref 0–53)
Calcium: 9.9 mg/dL (ref 8.4–10.5)
Creatinine, Ser: 0.8 mg/dL (ref 0.50–1.35)
GFR calc Af Amer: >90 mL/min (ref >90)
GFR calc non Af Amer: >90 mL/min (ref >90)
Glucose, Bld: 126 mg/dL — ABNORMAL HIGH (ref 70–99)
Sodium: 136 mEq/L (ref 135–145)
Total Protein: 7.4 g/dL (ref 6.0–8.3)

## 2012-08-14 LAB — CBC WITH DIFFERENTIAL/PLATELET
Eosinophils Absolute: 0.2 10*3/uL (ref 0.0–0.7)
Eosinophils Relative: 3 % (ref 0–5)
HCT: 49.8 % (ref 39.0–52.0)
Lymphs Abs: 2.8 10*3/uL (ref 0.7–4.0)
MCH: 32 pg (ref 26.0–34.0)
MCV: 92.7 fL (ref 78.0–100.0)
Monocytes Absolute: 0.6 10*3/uL (ref 0.1–1.0)
Platelets: 224 10*3/uL (ref 150–400)
RBC: 5.37 MIL/uL (ref 4.22–5.81)
RDW: 12.9 % (ref 11.5–15.5)

## 2012-08-14 MED ORDER — OXYCODONE-ACETAMINOPHEN 5-325 MG PO TABS: 1.0000 | ORAL_TABLET | ORAL | Status: DC | PRN

## 2012-08-14 NOTE — ED Notes (Signed)
Pt of Dr. Jenetta Downer. Scheduled to have surgery on his thumb on Fri. Pt states he is out of pain meds and the ones Dr. Hilda Lias had given him did not help. Pt states he is out of meds and has no refills left. Pt has not contacted Dr. Hilda Lias.

## 2012-08-17 NOTE — ED Provider Notes (Signed)
  CSN: [id number]     Arrival date & time 08/14/12  1336 History     First MD Initiated Contact with Patient 08/14/12 1453     Chief Complaint  Patient presents with  . Medication Refill   (Consider location/radiation/quality/duration/timing/severity/associated sxs/prior Treatment) HPI Comments: Steve Gardner is a 42 y.o. 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 This is a chronic condition which worsened after hitting it accidentally causing increased pain and swelling and subsequent  Scheduling with Dr. Hilda Lias for excision which is scheduled in 5 days.  He has run out of his hydrocodone which was not helping control pain which is constant and throbbing with radiation into his hand.       The history is provided by the patient.    Past Medical History  Diagnosis Date  . Strep throat   . Tonsillitis   . GERD (gastroesophageal reflux disease)     has "lpr"   Past Surgical History  Procedure Laterality Date  . Appendectomy     History reviewed. No pertinent family history. History  Substance Use Topics  . Smoking status: Current Every Day Smoker -- 0.50 packs/day for 18 years  . Smokeless tobacco: Not on file  . Alcohol Use: No    Review of Systems  Constitutional: Negative for fever.  Musculoskeletal: Positive for arthralgias. Negative for myalgias and joint swelling.  Skin: Negative for wound.  Neurological: Negative for weakness and numbness.    Allergies  Review of patient's allergies indicates no known allergies.  Home Medications   Current Outpatient Rx  Name  Route  Sig  Dispense  Refill  . Aspirin-Salicylamide-Caffeine (BC HEADACHE POWDER PO)   Oral   Take 1 packet by mouth every 6 (six) hours as needed (Pain).         Marland Kitchen esomeprazole (NEXIUM) 40 MG packet   Oral   Take 40 mg by mouth daily before breakfast.         . HYDROcodone-acetaminophen (NORCO/VICODIN) 5-325 MG per tablet   Oral   Take 1-2 tablets by mouth  every 4 (four) hours as needed for pain.         Marland Kitchen oxyCODONE-acetaminophen (PERCOCET/ROXICET) 5-325 MG per tablet   Oral   Take 1 tablet by mouth every 4 (four) hours as needed for pain.   20 tablet   0    BP 110/86  Pulse 75  Temp(Src) 98.1 F (36.7 C) (Oral)  SpO2 99% Physical Exam  Constitutional: He appears well-developed and well-nourished.  HENT:  Head: Atraumatic.  Neck: Normal range of motion.  Cardiovascular:  Pulses equal bilaterally  Musculoskeletal: He exhibits edema and tenderness.  Large tender cyst right distal volar thumb.  No red streaking or increased calor.  Skin intact.  Less than 3 sec cap refill distal to the lesion.  Neurological: He is alert. He has normal strength. He displays normal reflexes. No sensory deficit.  Equal strength  Skin: Skin is warm and dry.  Psychiatric: He has a normal mood and affect.    ED Course   Procedures (including critical care time)  Labs Reviewed - No data to display No results found. 1. Ganglion cyst     MDM  Pt with obvious source of pain.  Review of his chart and review of the [place] database - no history of substance abuse pattern.  Prescribed oxycodone #20.  F/u with Dr. Hilda Lias as planned.  Burgess Amor, PA-C 08/17/12 1025

## 2012-08-18 NOTE — ED Provider Notes (Signed)
Medical screening examination/treatment/procedure(s) were performed by non-physician practitioner and as supervising physician I was immediately available for consultation/collaboration.  Gevorg Brum, MD 08/18/12 0709 

## 2012-08-18 NOTE — OR Nursing (Signed)
Procedure cancelled  by patient due to family emergency and having to go out of town.

## 2012-08-19 ENCOUNTER — Ambulatory Visit (HOSPITAL_COMMUNITY)
Admission: RE | Admit: 2012-08-19 | Payer: BC Managed Care – PPO | Source: Ambulatory Visit | Admitting: Orthopaedic Surgery

## 2012-08-19 ENCOUNTER — Encounter (HOSPITAL_COMMUNITY): Admission: RE | Payer: Self-pay | Source: Ambulatory Visit

## 2012-08-19 SURGERY — WIDE EXCISION, LESION, UPPER EXTREMITY
Anesthesia: Choice | Laterality: Right

## 2012-08-20 ENCOUNTER — Emergency Department (HOSPITAL_COMMUNITY)
Admission: EM | Admit: 2012-08-20 | Discharge: 2012-08-20 | Disposition: A | Discharge status: Home / Self Care | Payer: BC Managed Care – PPO | Attending: Emergency Medicine | Admitting: Emergency Medicine

## 2012-08-20 ENCOUNTER — Encounter (HOSPITAL_COMMUNITY): Payer: Self-pay

## 2012-08-20 DIAGNOSIS — IMO0002 Reserved for concepts with insufficient information to code with codable children: Secondary | ICD-10-CM

## 2012-08-20 DIAGNOSIS — Z79899 Other long term (current) drug therapy: Secondary | ICD-10-CM | POA: Insufficient documentation

## 2012-08-20 DIAGNOSIS — L723 Sebaceous cyst: Secondary | ICD-10-CM | POA: Insufficient documentation

## 2012-08-20 DIAGNOSIS — F17200 Nicotine dependence, unspecified, uncomplicated: Secondary | ICD-10-CM | POA: Insufficient documentation

## 2012-08-20 DIAGNOSIS — K219 Gastro-esophageal reflux disease without esophagitis: Secondary | ICD-10-CM | POA: Insufficient documentation

## 2012-08-20 DIAGNOSIS — G8929 Other chronic pain: Secondary | ICD-10-CM | POA: Insufficient documentation

## 2012-08-20 MED ORDER — OXYCODONE-ACETAMINOPHEN 5-325 MG PO TABS
2.0000 | ORAL_TABLET | Freq: Once | ORAL | Status: AC
  Administered 2012-08-20: 2 via ORAL
  Filled 2012-08-20: qty 2

## 2012-08-20 NOTE — ED Notes (Signed)
Pt reports has cyst in r thumb.  Reports was scheduled for surgery but had to cancel it.  Pt says went to Dr. Sanjuan Dame office today but was told he was not in the office.  Pt says needs something for pain.

## 2012-08-21 NOTE — ED Provider Notes (Signed)
Medical screening examination/treatment/procedure(s) were performed by non-physician practitioner and as supervising physician I was immediately available for consultation/collaboration.   Laray Anger, DO 08/21/12 [phone]

## 2012-08-21 NOTE — ED Provider Notes (Signed)
CSN: [id number]     Arrival date & time 08/20/12  1404 History     First MD Initiated Contact with Patient 08/20/12 1424     Chief Complaint  Patient presents with  . Hand Pain   (Consider location/radiation/quality/duration/timing/severity/associated sxs/prior Treatment) HPI Comments: Steve Gardner is a 42 y.o. Male presenting again for persistent pain of a cyst on his right distal volar thumb.  He was seen by me 6 days ago.  At that time he was scheduled for surgery (for yesterday) with Dr. Hilda Lias, but was called out of town on a family emergency as his son was stranded in [place], therefore had to cancel this surgery.  He denies any new injury or worsened pain, describing his normal chronic pain from not having his percocet.  He has not contacted Dr. Jenetta Downer office regarding rescheduling this procedure or for pain management.       The history is provided by the patient.    Past Medical History  Diagnosis Date  . Strep throat   . Tonsillitis   . GERD (gastroesophageal reflux disease)     has "lpr"   Past Surgical History  Procedure Laterality Date  . Appendectomy     No family history on file. History  Substance Use Topics  . Smoking status: Current Every Day Smoker -- 0.50 packs/day for 18 years  . Smokeless tobacco: Not on file  . Alcohol Use: No    Review of Systems  Constitutional: Negative for fever.  Musculoskeletal: Negative for myalgias and joint swelling.       As mentioned in hpi  Neurological: Negative for weakness and numbness.    Allergies  Review of patient's allergies indicates no known allergies.  Home Medications   Current Outpatient Rx  Name  Route  Sig  Dispense  Refill  . Aspirin-Salicylamide-Caffeine (BC HEADACHE POWDER PO)   Oral   Take 1 packet by mouth every 6 (six) hours as needed (Pain).         Marland Kitchen esomeprazole (NEXIUM) 40 MG packet   Oral   Take 40 mg by mouth daily before breakfast.         . HYDROcodone-acetaminophen  (NORCO/VICODIN) 5-325 MG per tablet   Oral   Take 1-2 tablets by mouth every 4 (four) hours as needed for pain.         Marland Kitchen oxyCODONE-acetaminophen (PERCOCET/ROXICET) 5-325 MG per tablet   Oral   Take 1 tablet by mouth every 4 (four) hours as needed for pain.   20 tablet   0    BP 107/81  Pulse 90  Temp(Src) 97.8 F (36.6 C) (Oral)  Resp 18  Ht 6\' 3"  (1.905 m)  Wt 190 lb (86.183 kg)  BMI 23.75 kg/m2  SpO2 98% Physical Exam  Constitutional: He appears well-developed and well-nourished.  HENT:  Head: Atraumatic.  Neck: Normal range of motion.  Cardiovascular:  Pulses equal bilaterally  Musculoskeletal: He exhibits tenderness.  Large, soft tender cystic structrue right distal volar thumb,  Appears unchanged from prior visit.   Neurological: He is alert. He has normal strength. He displays normal reflexes. No sensory deficit.  Equal strength  Skin: Skin is warm and dry.  Psychiatric: He has a normal mood and affect.    ED Course   Procedures (including critical care time)  Labs Reviewed - No data to display No results found. 1. Cyst of finger     MDM  Discussed with Dr. Hilda Lias who has in dept.  States pt has cancelled his surgery 3 times now,  Stating this weeks surgery was cancelled due to family death which is discrepant from pt history given me.  Advised he needs to contact his office if he wants surgery, otherwise prn f/u planned per Dr. Hilda Lias.  Pt was given oxycodone #2 tabs while in dept (friend driving).  Advised ibuprofen and f/u with Dr. Hilda Lias.  No prescription given.  Pt understands.  Burgess Amor, PA-C 08/21/12 1419

## 2012-10-02 ENCOUNTER — Emergency Department (HOSPITAL_COMMUNITY)
Admission: EM | Admit: 2012-10-02 | Discharge: 2012-10-02 | Disposition: A | Discharge status: Home / Self Care | Payer: BC Managed Care – PPO | Attending: Emergency Medicine | Admitting: Emergency Medicine

## 2012-10-02 ENCOUNTER — Encounter (HOSPITAL_COMMUNITY): Payer: Self-pay

## 2012-10-02 DIAGNOSIS — S339XXA Sprain of unspecified parts of lumbar spine and pelvis, initial encounter: Secondary | ICD-10-CM | POA: Insufficient documentation

## 2012-10-02 DIAGNOSIS — K219 Gastro-esophageal reflux disease without esophagitis: Secondary | ICD-10-CM | POA: Insufficient documentation

## 2012-10-02 DIAGNOSIS — X500XXA Overexertion from strenuous movement or load, initial encounter: Secondary | ICD-10-CM | POA: Insufficient documentation

## 2012-10-02 DIAGNOSIS — Z8619 Personal history of other infectious and parasitic diseases: Secondary | ICD-10-CM | POA: Insufficient documentation

## 2012-10-02 DIAGNOSIS — Z79899 Other long term (current) drug therapy: Secondary | ICD-10-CM | POA: Insufficient documentation

## 2012-10-02 DIAGNOSIS — Y9389 Activity, other specified: Secondary | ICD-10-CM | POA: Insufficient documentation

## 2012-10-02 DIAGNOSIS — Y929 Unspecified place or not applicable: Secondary | ICD-10-CM | POA: Insufficient documentation

## 2012-10-02 DIAGNOSIS — F17200 Nicotine dependence, unspecified, uncomplicated: Secondary | ICD-10-CM | POA: Insufficient documentation

## 2012-10-02 DIAGNOSIS — S39012A Strain of muscle, fascia and tendon of lower back, initial encounter: Secondary | ICD-10-CM

## 2012-10-02 MED ORDER — CYCLOBENZAPRINE HCL 10 MG PO TABS
10.0000 mg | ORAL_TABLET | Freq: Once | ORAL | Status: AC
  Administered 2012-10-02: 10 mg via ORAL
  Filled 2012-10-02: qty 1

## 2012-10-02 MED ORDER — OXYCODONE-ACETAMINOPHEN 5-325 MG PO TABS
1.0000 | ORAL_TABLET | Freq: Once | ORAL | Status: AC
  Administered 2012-10-02: 1 via ORAL
  Filled 2012-10-02: qty 1

## 2012-10-02 MED ORDER — MELOXICAM 7.5 MG PO TABS: 7.5000 mg | ORAL_TABLET | Freq: Every day | ORAL | Status: DC

## 2012-10-02 MED ORDER — HYDROCODONE-ACETAMINOPHEN 5-325 MG PO TABS: 1.0000 | ORAL_TABLET | ORAL | Status: DC | PRN

## 2012-10-02 NOTE — ED Provider Notes (Signed)
CSN: [id number]     Arrival date & time 10/02/12  1838 History   First MD Initiated Contact with Patient 10/02/12 1852     Chief Complaint  Patient presents with  . Back Pain   (Consider location/radiation/quality/duration/timing/severity/associated sxs/prior Treatment) Patient is a 42 y.o. male presenting with back pain. The history is provided by the patient.  Back Pain Location:  Lumbar spine Quality:  Aching and shooting Radiates to:  Does not radiate Pain severity:  Severe (10/10) Pain is:  Same all the time Onset quality:  Gradual Duration:  2 weeks Timing:  Constant Progression:  Worsening Worsened by:  Ambulation Associated symptoms: no bladder incontinence, no bowel incontinence, no dysuria, no headaches and no leg pain    Steve Gardner is a 42 y.o. male who presents to the ED with low back pain x 2 weeks. States he works at [employer] and does a lot of lifting. Injured his back about a year ago and has pain off and on. Re injured a couple weeks ago. Has an appointment with Ortho in [place] next week for MRI but pain got worse today so he came to the ED.   Past Medical History  Diagnosis Date  . Strep throat   . Tonsillitis   . GERD (gastroesophageal reflux disease)     has "lpr"   Past Surgical History  Procedure Laterality Date  . Appendectomy     No family history on file. History  Substance Use Topics  . Smoking status: Current Every Day Smoker -- 0.50 packs/day for 18 years  . Smokeless tobacco: Not on file  . Alcohol Use: No    Review of Systems  HENT: Negative for neck pain.   Gastrointestinal: Negative for nausea, vomiting and bowel incontinence.  Genitourinary: Negative for bladder incontinence, dysuria, urgency and frequency.  Musculoskeletal: Positive for back pain.  Skin: Negative for wound.  Neurological: Negative for headaches.  Psychiatric/Behavioral: The patient is not nervous/anxious.     Allergies  Review of patient's allergies indicates no  known allergies.  Home Medications   Current Outpatient Rx  Name  Route  Sig  Dispense  Refill  . Aspirin-Salicylamide-Caffeine (BC HEADACHE POWDER PO)   Oral   Take 1 packet by mouth every 6 (six) hours as needed (Pain).         Marland Kitchen esomeprazole (NEXIUM) 40 MG packet   Oral   Take 40 mg by mouth daily before breakfast.         . HYDROcodone-acetaminophen (NORCO/VICODIN) 5-325 MG per tablet   Oral   Take 1-2 tablets by mouth every 4 (four) hours as needed for pain.         Marland Kitchen oxyCODONE-acetaminophen (PERCOCET/ROXICET) 5-325 MG per tablet   Oral   Take 1 tablet by mouth every 4 (four) hours as needed for pain.   20 tablet   0    BP 123/81  Pulse 70  Temp(Src) 98.1 F (36.7 C) (Oral)  Resp 20  Ht 6\' 2"  (1.88 m)  Wt 190 lb (86.183 kg)  BMI 24.38 kg/m2  SpO2 99% Physical Exam  Nursing note and vitals reviewed. Constitutional: He is oriented to person, place, and time. He appears well-developed and well-nourished.  HENT:  Head: Normocephalic and atraumatic.  Eyes: Conjunctivae and EOM are normal.  Neck: Normal range of motion. Neck supple. No spinous process tenderness and no muscular tenderness present.  Cardiovascular: Normal rate, regular rhythm and normal heart sounds.   Pulmonary/Chest: Effort normal and breath  sounds normal.  Abdominal: Soft. There is no tenderness.  Musculoskeletal:       Lumbar back: He exhibits decreased range of motion, tenderness and spasm. He exhibits no deformity and no laceration.       Back:  Tenderness with palpation and range of motion lumbar area. Pedal pulses present and equal, adequate circulation, good touch sensation. Ambulatory with steady gait, no foot drag.  Neurological: He is alert and oriented to person, place, and time. He has normal strength and normal reflexes. No cranial nerve deficit or sensory deficit. Gait normal.  Skin: Skin is warm and dry.  Psychiatric: He has a normal mood and affect. His behavior is normal.     ED Course  Procedures  MDM  42 y.o. male with lumbosacral strain. He is stable for discharge home without any immediate complications. He remains neurovascularly intact. He will keep his follow up appointment with the Orthopedist for his MRI next week. Will treat pain and muscle spasm.  Discussed with the patient and all questioned fully answered. He will return if any problems arise.    Medication List    STOP taking these medications       BC HEADACHE POWDER PO     oxyCODONE-acetaminophen 5-325 MG per tablet  Commonly known as:  PERCOCET/ROXICET      TAKE these medications       HYDROcodone-acetaminophen 5-325 MG per tablet  Commonly known as:  NORCO/VICODIN  Take 1 tablet by mouth every 4 (four) hours as needed.     meloxicam 7.5 MG tablet  Commonly known as:  MOBIC  Take 1 tablet (7.5 mg total) by mouth daily.      ASK your doctor about these medications       esomeprazole 40 MG packet  Commonly known as:  NEXIUM  Take 40 mg by mouth daily before breakfast.           Janne Napoleon, NP 10/02/12 1910

## 2012-10-02 NOTE — ED Notes (Signed)
Pt was at work today when back began to hurt. Ambulated to triage. Hx of back pain

## 2012-10-02 NOTE — ED Notes (Signed)
Low back pain for 2 weeks, does heavy lifting at his job.  Has appt with ortho next week.

## 2012-10-02 NOTE — ED Provider Notes (Signed)
Medical screening examination/treatment/procedure(s) were performed by non-physician practitioner and as supervising physician I was immediately available for consultation/collaboration.    Vida Roller, MD 10/02/12 [phone]

## 2012-11-19 ENCOUNTER — Emergency Department (HOSPITAL_COMMUNITY)
Admission: EM | Admit: 2012-11-19 | Discharge: 2012-11-19 | Disposition: A | Discharge status: Home / Self Care | Payer: BC Managed Care – PPO | Attending: Emergency Medicine | Admitting: Emergency Medicine

## 2012-11-19 ENCOUNTER — Encounter (HOSPITAL_COMMUNITY): Payer: Self-pay | Admitting: Emergency Medicine

## 2012-11-19 DIAGNOSIS — S39012D Strain of muscle, fascia and tendon of lower back, subsequent encounter: Secondary | ICD-10-CM

## 2012-11-19 DIAGNOSIS — K219 Gastro-esophageal reflux disease without esophagitis: Secondary | ICD-10-CM | POA: Insufficient documentation

## 2012-11-19 DIAGNOSIS — G8929 Other chronic pain: Secondary | ICD-10-CM | POA: Insufficient documentation

## 2012-11-19 DIAGNOSIS — Z79899 Other long term (current) drug therapy: Secondary | ICD-10-CM | POA: Insufficient documentation

## 2012-11-19 DIAGNOSIS — F17200 Nicotine dependence, unspecified, uncomplicated: Secondary | ICD-10-CM | POA: Insufficient documentation

## 2012-11-19 DIAGNOSIS — M545 Low back pain, unspecified: Secondary | ICD-10-CM | POA: Insufficient documentation

## 2012-11-19 DIAGNOSIS — R52 Pain, unspecified: Secondary | ICD-10-CM | POA: Insufficient documentation

## 2012-11-19 DIAGNOSIS — Z8619 Personal history of other infectious and parasitic diseases: Secondary | ICD-10-CM | POA: Insufficient documentation

## 2012-11-19 MED ORDER — PREDNISONE 10 MG PO TABS: ORAL_TABLET | ORAL | Status: DC

## 2012-11-19 MED ORDER — HYDROCODONE-ACETAMINOPHEN 5-325 MG PO TABS: 1.0000 | ORAL_TABLET | ORAL | Status: DC | PRN

## 2012-11-19 MED ORDER — METHOCARBAMOL 500 MG PO TABS: 1000.0000 mg | ORAL_TABLET | Freq: Four times a day (QID) | ORAL | Status: AC

## 2012-11-19 NOTE — ED Provider Notes (Signed)
CSN: [id number]     Arrival date & time 11/19/12  0825 History   First MD Initiated Contact with Patient 11/19/12 [phone]     Chief Complaint  Patient presents with  . Back Pain   (Consider location/radiation/quality/duration/timing/severity/associated sxs/prior Treatment) HPI Comments: Steve Gardner is a 42 y.o. Male presenting with acute on chronic low back pain.  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  Patient denies any new injury specifically but has had worse pain since this morning.  There is no radiation of pain into his lower extremities.  There has been no weakness or numbness in the lower extremities and no urinary or bowel retention or incontinence.  Patient does not have a history of cancer or IVDU.  He was evaluated by an orthopedist last moth in [place] at which time an MRI was performed.  Patient is unclear of his exact diagnosis, but was told that surgery will not help.  He was referred to pain management in [place], but will not establish care until December 5.  He takes goody powders which gives minimal relief of his pain.       The history is provided by the patient.    Past Medical History  Diagnosis Date  . Strep throat   . Tonsillitis   . GERD (gastroesophageal reflux disease)     has "lpr"   Past Surgical History  Procedure Laterality Date  . Appendectomy    . Thumb surgery     No family history on file. History  Substance Use Topics  . Smoking status: Current Every Day Smoker -- 0.50 packs/day for 18 years  . Smokeless tobacco: Not on file  . Alcohol Use: No    Review of Systems  Constitutional: Negative for fever.  Respiratory: Negative for shortness of breath.   Cardiovascular: Negative for chest pain and leg swelling.  Gastrointestinal: Negative for abdominal pain, constipation and abdominal distention.  Genitourinary: Negative for dysuria, urgency, frequency, flank pain  and difficulty urinating.  Musculoskeletal: Positive for back pain. Negative for gait problem and joint swelling.  Skin: Negative for rash.  Neurological: Negative for weakness and numbness.    Allergies  Review of patient's allergies indicates no known allergies.  Home Medications   Current Outpatient Rx  Name  Route  Sig  Dispense  Refill  . Aspirin-Acetaminophen-Caffeine (GOODYS EXTRA STRENGTH PO)   Oral   Take 2 packets by mouth every 6 (six) hours as needed (back pain).         Marland Kitchen esomeprazole (NEXIUM) 40 MG packet   Oral   Take 40 mg by mouth daily before breakfast.         . HYDROcodone-acetaminophen (NORCO/VICODIN) 5-325 MG per tablet   Oral   Take 1 tablet by mouth every 4 (four) hours as needed for moderate pain.   20 tablet   0   . methocarbamol (ROBAXIN) 500 MG tablet   Oral   Take 2 tablets (1,000 mg total) by mouth 4 (four) times daily.   40 tablet   0   . predniSONE (DELTASONE) 10 MG tablet      6, 5, 4, 3, 2 then 1 tablet by mouth daily for 6 days total.   21 tablet   0    BP 111/85  Pulse 77  Temp(Src) 98.1 F (36.7 C) (Oral)  Resp 18  Ht 6\' 2"  (1.88 m)  Wt 195 lb (88.451  kg)  BMI 25.03 kg/m2  SpO2 97% Physical Exam  Nursing note and vitals reviewed. Constitutional: He appears well-developed and well-nourished.  HENT:  Head: Normocephalic.  Eyes: Conjunctivae are normal.  Neck: Normal range of motion. Neck supple.  Cardiovascular: Normal rate and intact distal pulses.   Pedal pulses normal.  Pulmonary/Chest: Effort normal.  Abdominal: Soft. Bowel sounds are normal. He exhibits no distension and no mass.  Musculoskeletal: Normal range of motion. He exhibits no edema.       Lumbar back: He exhibits tenderness and bony tenderness. He exhibits no swelling, no edema and no spasm.  Midline and paralumbar ttp.   no SI joint tenderness.  Neurological: He is alert. He has normal strength. He displays no atrophy and no tremor. No sensory  deficit. Gait normal.  Reflex Scores:      Patellar reflexes are 2+ on the right side and 2+ on the left side.      Achilles reflexes are 2+ on the right side and 2+ on the left side. No strength deficit noted in hip and knee flexor and extensor muscle groups.  Ankle flexion and extension intact.  Skin: Skin is warm and dry.  Psychiatric: He has a normal mood and affect.    ED Course  Procedures (including critical care time) Labs Review Labs Reviewed - No data to display Imaging Review No results found.  EKG Interpretation   None       MDM   1. Lumbar strain, subsequent encounter    Acute on chronic low back pain.  No neuro deficit on exam or by history to suggest emergent or surgical presentation.  Also discussed worsened sx that should prompt immediate re-evaluation including distal weakness, bowel/bladder retention/incontinence.  Pt was prescribed prednisone, robaxin, hydrocodone,  Encouraged to minimize lifting,  Heat pad to low back.  Establish with pain management which is being arranged.  Pt understands plan.          Burgess Amor, PA-C 11/19/12 1729

## 2012-11-19 NOTE — ED Notes (Signed)
Pt reports has slipped disc in lower back and has chronic lower back pain.  Reports pain flared up after work this morning.  Denies pain radiating into buttocks or legs.  Has appt in December with Pain management doctor.

## 2012-11-20 NOTE — ED Provider Notes (Signed)
Medical screening examination/treatment/procedure(s) were performed by non-physician practitioner and as supervising physician I was immediately available for consultation/collaboration.  EKG Interpretation   None         Benny Lennert, MD 11/20/12 1600

## 2013-06-27 IMAGING — CT CT NECK W/ CM
3 of 5 series · 13 of 33 positions shown, 16 images · IV contrast (Omnipaque 300)
Comparison: None.

CLINICAL DATA: Pain and swelling.  Sore throat.

CT NECK WITH CONTRAST
TECHNIQUE: Multidetector CT imaging of the neck was performed with
intravenous contrast.
Contrast: 75mL OMNIPAQUE IOHEXOL 300 MG/ML  SOLN

[Series 2: soft tissue neck 2.0 b31s · axial · 0.40mm/px · z∈[+41,+197]mm · 5 of 118 slices shown, 7 images]
[im 20/118  soft-tissue]
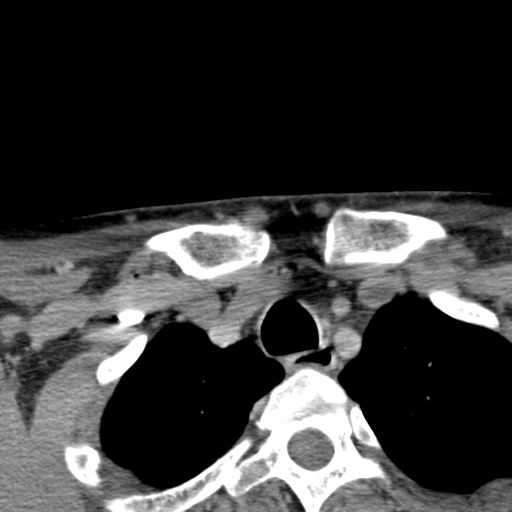
[im 20/118  bone]
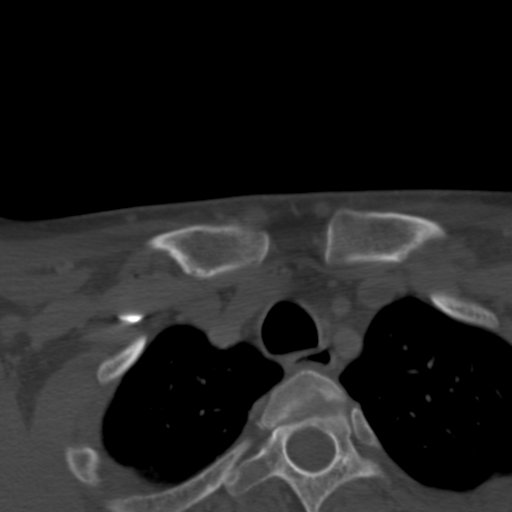
[im 40/118  bone]
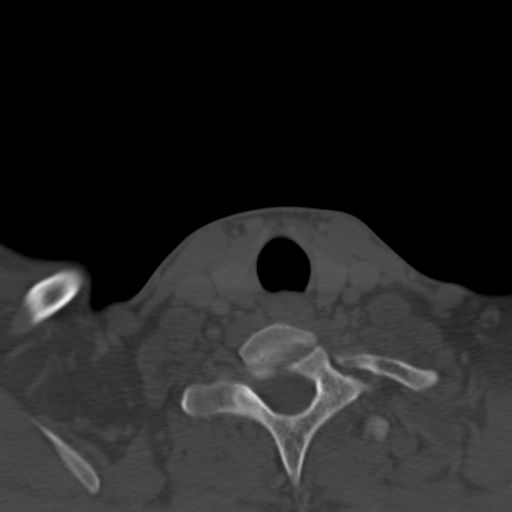
[im 59/118  bone]
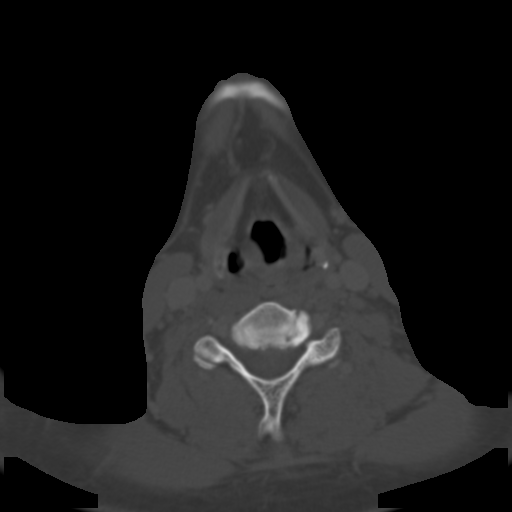
[im 79/118  bone]
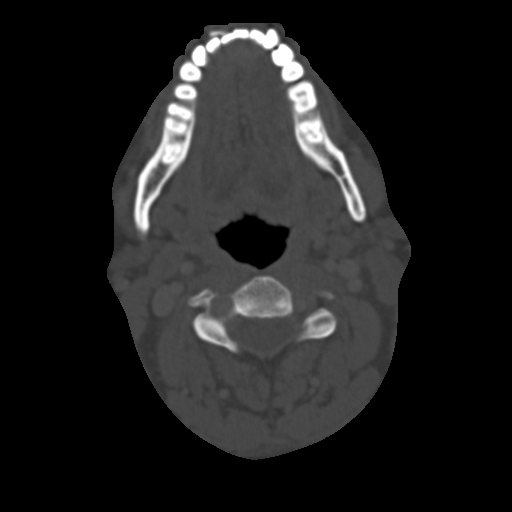
[im 98/118  soft-tissue]
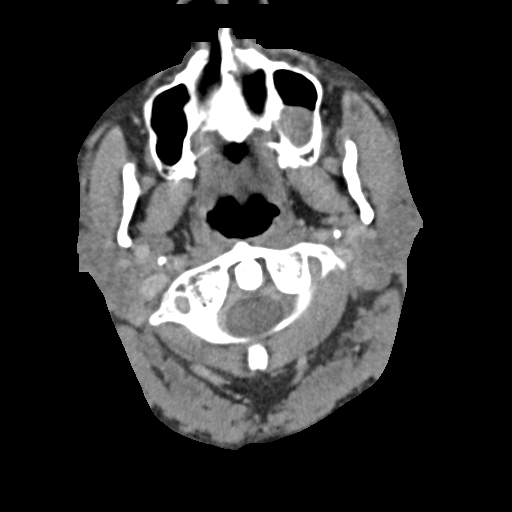
[im 98/118  bone]
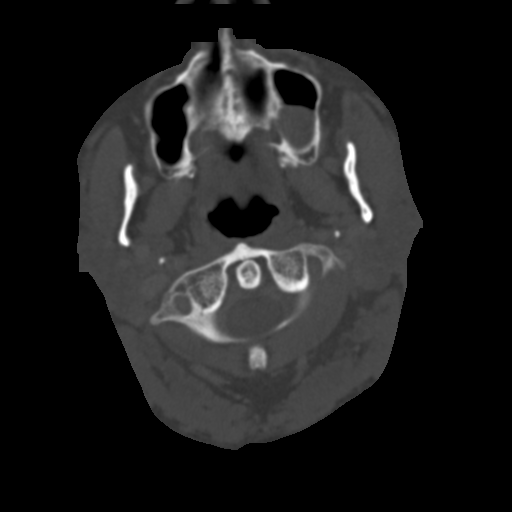

[Series 4: neck 2.0 soft tissue sag · sagittal · 0.42mm/px · 5 of 77 slices shown, 6 images]
[im 26/77  bone]
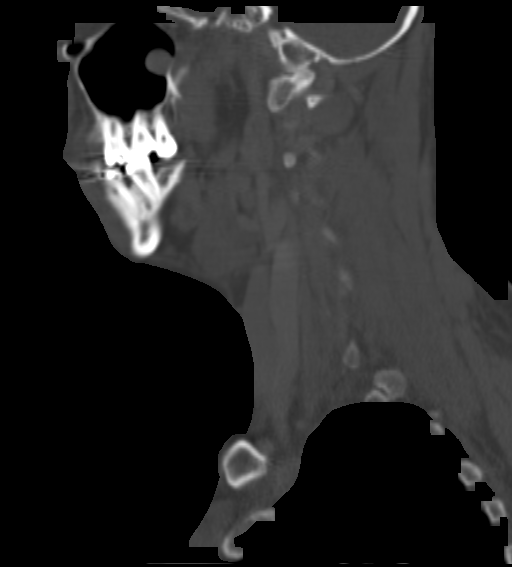
[im 32/77  bone]
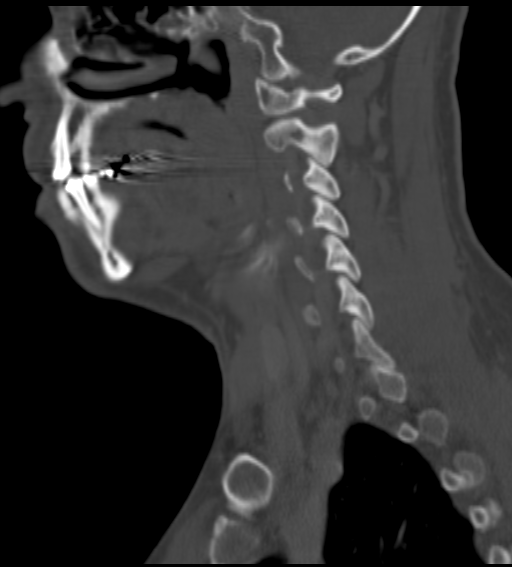
[im 39/77  soft-tissue]
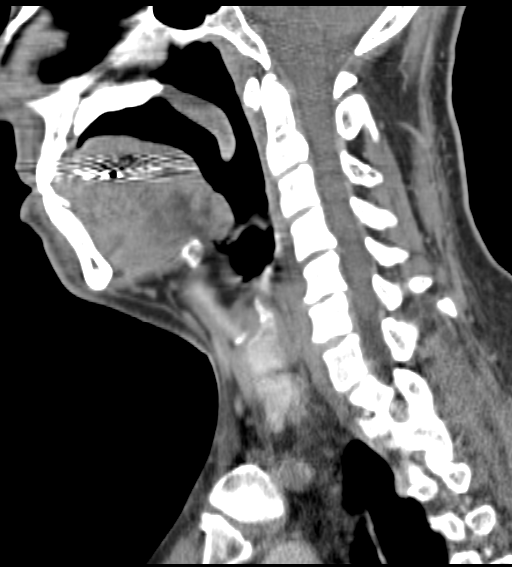
[im 39/77  bone]
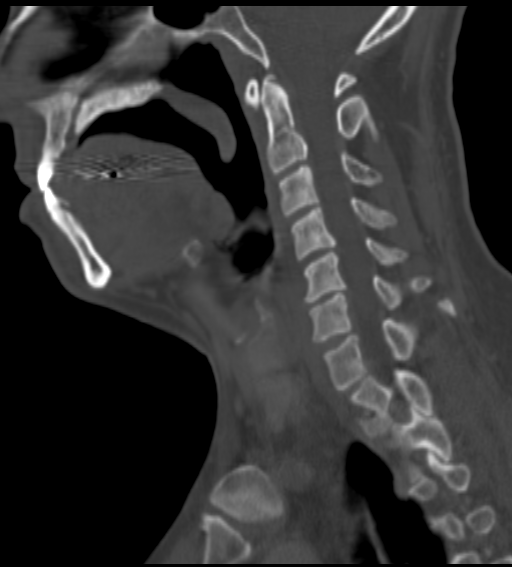
[im 45/77  bone]
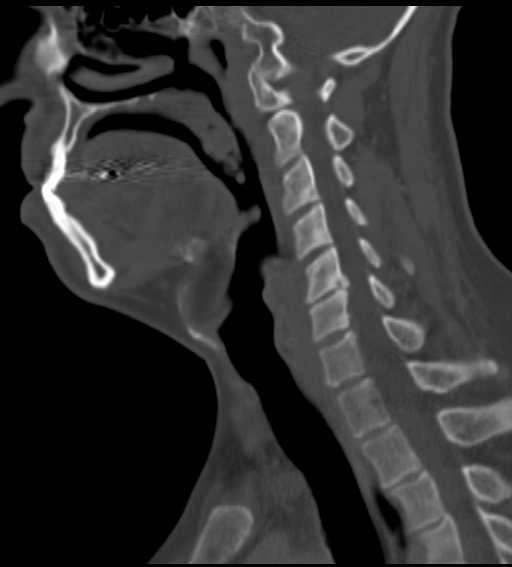
[im 51/77  bone]
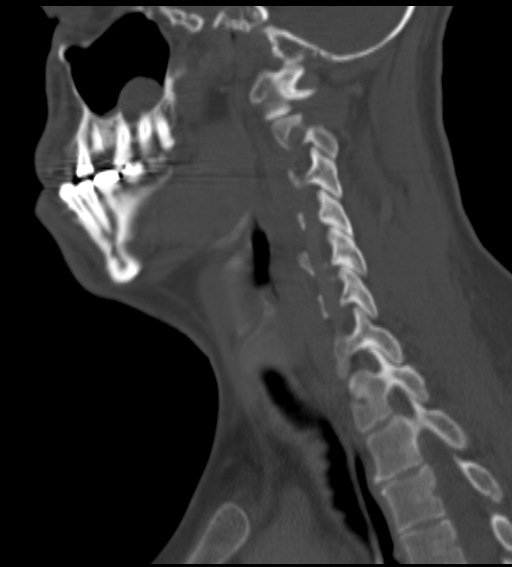

[Series 5: neck 2.0 soft tissue coro · coronal · 0.29mm/px · 3 of 86 slices shown]
[im 18/86  bone]
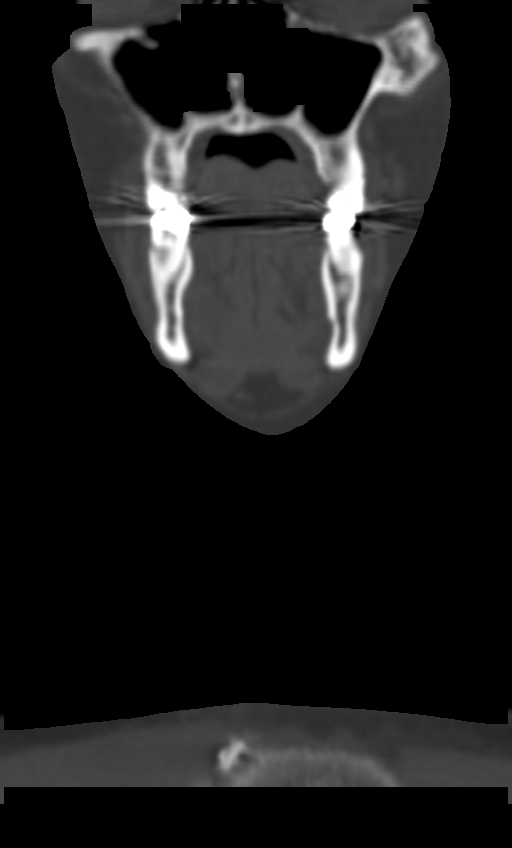
[im 35/86  bone]
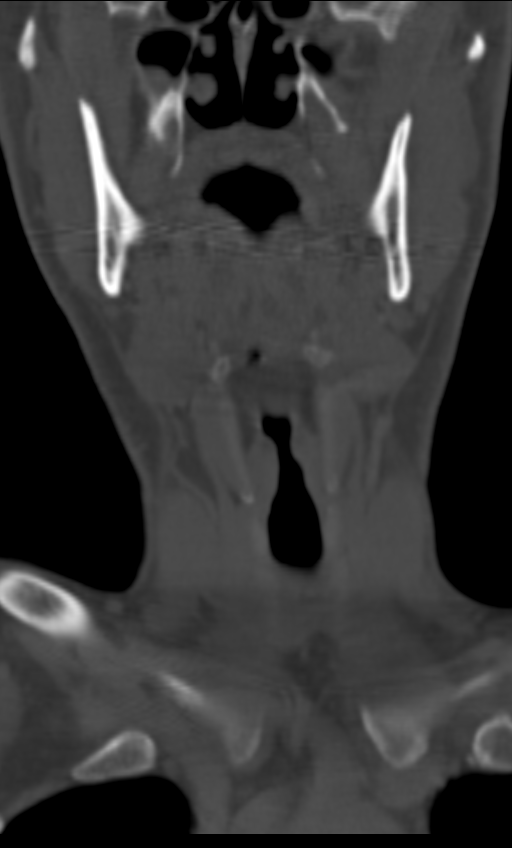
[im 52/86  bone]
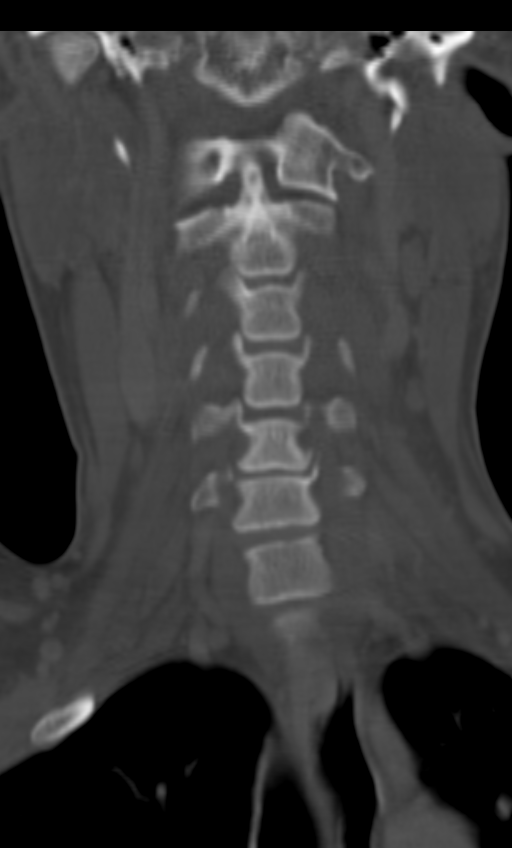

[13 of 33 positions shown; findings below may reference images not displayed]

FINDINGS: The visualized brainstem and cerebellum are grossly
normal.  The skull base is normal.  Scattered ethmoid sinus disease
and small mucous retention cysts or polyps in the maxillary
sinuses.  The mastoid air cells and middle ear cavities are clear.

The parotid and submandibular glands are symmetric and normal.  No
inflammatory changes or mass lesions.  The tongue base and floor
the mouth are normal.  The epiglottis is normal and the
aryepiglottic folds are normal.  Slightly prominent lymphoid tissue
at the base the tongue and mildly prominent tonsils but no findings
for significant tonsillar inflammation/edema and no abscess.  The
piriform sinus and vallecular air spaces are normal.  The
paraglottic fat planes are maintained.

The thyroid gland is normal.  The major vascular structures are
normal.  There are small scattered neck nodes bilaterally but no
mass or adenopathy.  The upper esophagus is unremarkable.  No
supraclavicular or superior mediastinal lymphadenopathy.  The lung
apices are clear.
IMPRESSION: Unremarkable CT examination of the neck.  No findings for
significant inflammatory process, abscess, mass or adenopathy.

## 2013-11-01 ENCOUNTER — Emergency Department (HOSPITAL_COMMUNITY)
Admission: EM | Admit: 2013-11-01 | Discharge: 2013-11-01 | Disposition: A | Discharge status: Home / Self Care | Payer: BC Managed Care – PPO | Attending: Emergency Medicine | Admitting: Emergency Medicine

## 2013-11-01 ENCOUNTER — Encounter (HOSPITAL_COMMUNITY): Payer: Self-pay | Admitting: Emergency Medicine

## 2013-11-01 DIAGNOSIS — Z8709 Personal history of other diseases of the respiratory system: Secondary | ICD-10-CM | POA: Insufficient documentation

## 2013-11-01 DIAGNOSIS — K088 Other specified disorders of teeth and supporting structures: Secondary | ICD-10-CM | POA: Insufficient documentation

## 2013-11-01 DIAGNOSIS — Z720 Tobacco use: Secondary | ICD-10-CM | POA: Insufficient documentation

## 2013-11-01 DIAGNOSIS — K0889 Other specified disorders of teeth and supporting structures: Secondary | ICD-10-CM

## 2013-11-01 MED ORDER — AMOXICILLIN 250 MG/5ML PO SUSR
500.0000 mg | Freq: Once | ORAL | Status: AC
  Administered 2013-11-01: 500 mg via ORAL
  Filled 2013-11-01: qty 10

## 2013-11-01 MED ORDER — PENICILLIN V POTASSIUM 250 MG PO TABS: 500.0000 mg | ORAL_TABLET | Freq: Once | ORAL | Status: DC

## 2013-11-01 MED ORDER — OXYCODONE-ACETAMINOPHEN 5-325 MG PO TABS: 2.0000 | ORAL_TABLET | Freq: Once | ORAL | Status: DC

## 2013-11-01 MED ORDER — HYDROCODONE-ACETAMINOPHEN 7.5-325 MG/15ML PO SOLN
15.0000 mL | Freq: Once | ORAL | Status: AC
  Administered 2013-11-01: 15 mL via ORAL
  Filled 2013-11-01: qty 15

## 2013-11-01 MED ORDER — PENICILLIN V POTASSIUM 500 MG PO TABS: 500.0000 mg | ORAL_TABLET | Freq: Four times a day (QID) | ORAL | Status: DC

## 2013-11-01 MED ORDER — AMOXICILLIN 250 MG/5ML PO SUSR: 500.0000 mg | Freq: Three times a day (TID) | ORAL | Status: DC

## 2013-11-01 NOTE — Discharge Instructions (Signed)

## 2013-11-01 NOTE — ED Provider Notes (Signed)
CSN: [id number]     Arrival date & time 11/01/13  [id number] History   First MD Initiated Contact with Patient 11/01/13 0354     Chief Complaint  Patient presents with  . Dental Pain     Patient is a 43 y.o. male presenting with tooth pain. The history is provided by the patient.  Dental Pain Location:  Upper Severity:  Moderate Onset quality:  Gradual Duration:  1 day Timing:  Constant Progression:  Worsening Chronicity:  Recurrent Relieved by:  Nothing Worsened by:  Jaw movement and pressure Associated symptoms: no facial swelling and no fever     Past Medical History  Diagnosis Date  . Strep throat   . Tonsillitis   . GERD (gastroesophageal reflux disease)     has "lpr"   Past Surgical History  Procedure Laterality Date  . Appendectomy    . Thumb surgery     History reviewed. No pertinent family history. History  Substance Use Topics  . Smoking status: Current Every Day Smoker -- 0.50 packs/day for 18 years    Types: Cigarettes  . Smokeless tobacco: Not on file  . Alcohol Use: No    Review of Systems  Constitutional: Negative for fever.  HENT: Negative for facial swelling.   Gastrointestinal: Negative for vomiting.      Allergies  Review of patient's allergies indicates no known allergies.  Home Medications   Prior to Admission medications   Medication Sig Start Date End Date Taking? Authorizing Provider  amoxicillin (AMOXIL) 250 MG/5ML suspension Take 10 mLs (500 mg total) by mouth 3 (three) times daily. 11/01/13   Joya Gaskinsonald W Rene Gonsoulin, MD   BP 113/83  Pulse 81  Temp(Src) 97.8 F (36.6 C) (Oral)  Resp 16  Ht 6\' 2"  (1.88 m)  Wt 183 lb (83.008 kg)  BMI 23.49 kg/m2  SpO2 97% Physical Exam CONSTITUTIONAL: Well developed/well nourished HEAD AND FACE: Normocephalic/atraumatic EYES: EOMI/PERRL ENMT: Mucous membranes moist.  Poor dentition.  No trismus.  No focal abscess noted.  Tenderness to palpation of left upper molar NECK: supple no meningeal  signs CV: S1/S2 noted, no murmurs/rubs/gallops noted LUNGS: Lungs are clear to auscultation bilaterally, no apparent distress ABDOMEN: soft, nontender, no rebound or guarding NEURO: Pt is awake/alert, moves all extremitiesx4 EXTREMITIES:full ROM SKIN: warm, color normal  ED Course  Procedures   MDM   Final diagnoses:  Pain, dental    Nursing notes including past medical history and social history reviewed and considered in documentation     Joya Gaskinsonald W Mihaela Fajardo, MD 11/01/13 [phone]

## 2013-11-01 NOTE — ED Notes (Signed)
Upper left molar pain, broken tooth

## 2013-11-08 ENCOUNTER — Encounter (HOSPITAL_COMMUNITY): Payer: Self-pay

## 2013-11-08 ENCOUNTER — Emergency Department (HOSPITAL_COMMUNITY)
Admission: EM | Admit: 2013-11-08 | Discharge: 2013-11-08 | Disposition: A | Discharge status: Home / Self Care | Payer: BC Managed Care – PPO | Attending: Emergency Medicine | Admitting: Emergency Medicine

## 2013-11-08 DIAGNOSIS — Z8719 Personal history of other diseases of the digestive system: Secondary | ICD-10-CM | POA: Insufficient documentation

## 2013-11-08 DIAGNOSIS — B370 Candidal stomatitis: Secondary | ICD-10-CM

## 2013-11-08 DIAGNOSIS — Z720 Tobacco use: Secondary | ICD-10-CM | POA: Insufficient documentation

## 2013-11-08 DIAGNOSIS — Z792 Long term (current) use of antibiotics: Secondary | ICD-10-CM | POA: Insufficient documentation

## 2013-11-08 DIAGNOSIS — B379 Candidiasis, unspecified: Secondary | ICD-10-CM | POA: Insufficient documentation

## 2013-11-08 MED ORDER — FLUCONAZOLE 40 MG/ML PO SUSR: 200.0000 mg | Freq: Every day | ORAL | Status: DC

## 2013-11-08 NOTE — ED Notes (Signed)
Pt alert & oriented x4, stable gait. Patient given discharge instructions, paperwork & prescription(s). Patient  instructed to stop at the registration desk to finish any additional paperwork. Patient verbalized understanding. Pt left department w/ no further questions. 

## 2013-11-08 NOTE — ED Notes (Signed)
Patient states that he has thrush on his tongue.

## 2013-11-08 NOTE — Discharge Instructions (Signed)
Thrush, Adult  Thrush, also called oral candidiasis, is a fungal infection that develops in the mouth and throat and on the tongue. It causes white patches to form on the mouth and tongue. Thrush is most common in older adults, but it can occur at any age.  Many cases of thrush are mild, but this infection can also be more serious. Thrush can be a recurring problem for people who have chronic illnesses or who take medicines that limit the body's ability to fight infection. Because these people have difficulty fighting infections, the fungus that causes thrush can spread throughout the body. This can cause life-threatening blood or organ infections. CAUSES  Thrush is usually caused by a yeast called Candida albicans. This fungus is normally present in small amounts in the mouth and on other mucous membranes. It usually causes no harm. However, when conditions are present that allow the fungus to grow uncontrolled, it invades surrounding tissues and becomes an infection. Less often, other Candida species can also lead to thrush.  RISK FACTORS Thrush is more likely to develop in the following people:  People with an impaired ability to fight infection (weakened immune system).   Older adults.   People with HIV.   People with diabetes.   People with dry mouth (xerostomia).   Pregnant women.   People with poor dental care, especially those who have false teeth.   People who use antibiotic medicines.  SIGNS AND SYMPTOMS  Thrush can be a mild infection that causes no symptoms. If symptoms develop, they may include:   A burning feeling in the mouth and throat. This can occur at the start of a thrush infection.   White patches that adhere to the mouth and tongue. The tissue around the patches may be red, raw, and painful. If rubbed (during tooth brushing, for example), the patches and the tissue of the mouth may bleed easily.   A bad taste in the mouth or difficulty tasting foods.    Cottony feeling in the mouth.   Pain during eating and swallowing. DIAGNOSIS  Your health care provider can usually diagnose thrush by looking in your mouth and asking you questions about your health.  TREATMENT  Medicines that help prevent the growth of fungi (antifungals) are the standard treatment for thrush. These medicines are either applied directly to the affected area (topical) or swallowed (oral). The treatment will depend on the severity of the condition.  Mild Thrush Mild cases of thrush may clear up with the use of an antifungal mouth rinse or lozenges. Treatment usually lasts about 14 days.  Moderate to Severe Thrush  More severe thrush infections that have spread to the esophagus are treated with an oral antifungal medicine. A topical antifungal medicine may also be used.   For some severe infections, a treatment period longer than 14 days may be needed.   Oral antifungal medicines are almost never used during pregnancy because the fetus may be harmed. However, if a pregnant woman has a rare, severe thrush infection that has spread to her blood, oral antifungal medicines may be used. In this case, the risk of harm to the mother and fetus from the severe thrush infection may be greater than the risk posed by the use of antifungal medicines.  Persistent or Recurrent Thrush For cases of thrush that do not go away or keep coming back, treatment may involve the following:   Treatment may be needed twice as long as the symptoms last.   Treatment will   include both oral and topical antifungal medicines.   People with weakened immune systems can take an antifungal medicine on a continuous basis to prevent thrush infections.  It is important to treat conditions that make you more likely to get thrush, such as diabetes or HIV.  HOME CARE INSTRUCTIONS   Only take over-the-counter or prescription medicine as directed by your health care provider. Talk to your health care  provider about an over-the-counter medicine called gentian violet, which kills bacteria and fungi.   Eat plain, unflavored yogurt as directed by your health care provider. Check the label to make sure the yogurt contains live cultures. This yogurt can help healthy bacteria grow in the mouth that can stop the growth of the fungus that causes thrush.   Try these measures to help reduce the discomfort of thrush:   Drink cold liquids such as water or iced tea.   Try flavored ice treats or frozen juices.   Eat foods that are easy to swallow, such as gelatin, ice cream, or custard.   If the patches in your mouth are painful, try drinking from a straw.   Rinse your mouth several times a day with a warm saltwater rinse. You can make the saltwater mixture with 1 tsp (6 g) of salt in 8 fl oz (0.2 L) of warm water.   If you wear dentures, remove the dentures before going to bed, brush them vigorously, and soak them in a cleaning solution as directed by your health care provider.   Women who are breastfeeding should clean their nipples with an antifungal medicine as directed by their health care provider. Dry the nipples after breastfeeding. Applying lanolin-containing body lotion may help relieve nipple soreness.  SEEK MEDICAL CARE IF:  Your symptoms are getting worse or are not improving within 7 days of starting treatment.   You have symptoms of spreading infection, such as white patches on the skin outside of the mouth.   You are nursing and you have redness, burning, or pain in the nipples that is not relieved with treatment.  MAKE SURE YOU:  Understand these instructions.  Will watch your condition.  Will get help right away if you are not doing well or get worse. Document Released: 09/20/2003 Document Revised: 10/15/2012 Document Reviewed: 07/28/2012 ExitCare Patient Information 2015 [employer]. This information is not intended to replace advice given to you by your  health care provider. Make sure you discuss any questions you have with your health care provider.  

## 2013-11-08 NOTE — ED Provider Notes (Signed)
CSN: [id number]     Arrival date & time 11/08/13  2004 History   First MD Initiated Contact with Patient 11/08/13 2209     Chief Complaint  Patient presents with  . Thrush     (Consider location/radiation/quality/duration/timing/severity/associated sxs/prior Treatment) Patient is a 43 y.o. male presenting with pharyngitis. The history is provided by the patient. No language interpreter was used.  Sore Throat This is a new problem. The current episode started today. The problem occurs constantly. The problem has been gradually worsening. Associated symptoms include a sore throat. Nothing aggravates the symptoms. He has tried nothing for the symptoms. The treatment provided moderate relief.  Pt complains of having thrush.   Pt reports pain in mouth, tongue and throat.   Pt has had in the past while taking antibiotics  Past Medical History  Diagnosis Date  . Strep throat   . Tonsillitis   . GERD (gastroesophageal reflux disease)     has "lpr"   Past Surgical History  Procedure Laterality Date  . Appendectomy    . Thumb surgery     No family history on file. History  Substance Use Topics  . Smoking status: Current Every Day Smoker -- 0.50 packs/day for 18 years    Types: Cigarettes  . Smokeless tobacco: Not on file  . Alcohol Use: No    Review of Systems  HENT: Positive for sore throat.   All other systems reviewed and are negative.     Allergies  Review of patient's allergies indicates no known allergies.  Home Medications   Prior to Admission medications   Medication Sig Start Date End Date Taking? Authorizing Provider  amoxicillin (AMOXIL) 250 MG/5ML suspension Take 10 mLs (500 mg total) by mouth 3 (three) times daily. 11/01/13   Joya Gaskinsonald W Wickline, MD  fluconazole (DIFLUCAN) 40 MG/ML suspension Take 5 mLs (200 mg total) by mouth daily. 11/08/13   Elson AreasLeslie K Lindamarie Maclachlan, PA-C   BP 104/89 mmHg  Pulse 74  Temp(Src) 98.2 F (36.8 C) (Oral)  Resp 16  Ht 6\' 2"  (1.88 m)  Wt  180 lb (81.647 kg)  BMI 23.10 kg/m2  SpO2 98% Physical Exam  Constitutional: He is oriented to person, place, and time. He appears well-developed and well-nourished.  HENT:  Tongue coated, throat, erythematous,   Eyes: Conjunctivae are normal. Pupils are equal, round, and reactive to light.  Neck: Normal range of motion.  Pulmonary/Chest: Effort normal.  Abdominal: Soft.  Musculoskeletal: Normal range of motion.  Neurological: He is alert and oriented to person, place, and time. He has normal reflexes.  Skin: Skin is warm.  Psychiatric: He has a normal mood and affect.    ED Course  Procedures (including critical care time) Labs Review Labs Reviewed - No data to display  Imaging Review No results found.   EKG Interpretation None      MDM   Final diagnoses:  Thrush    Diflucan  Follow up with dentist for dental issues    Elson AreasLeslie K Zela Sobieski, PA-C 11/09/13 0000

## 2015-12-13 ENCOUNTER — Ambulatory Visit: Payer: Self-pay | Admitting: Orthopaedic Surgery

## 2016-08-20 ENCOUNTER — Telehealth: Payer: Self-pay | Admitting: Orthopaedic Surgery

## 2016-08-20 NOTE — Telephone Encounter (Signed)
Patient called, states had an appointment in December, however, insurance was not in effect then.  States has film and report of his back, from [facility]([place], [place]) and is to bring in for Dr Hilda LiasKeeling to review and advise. States has no history of back surgery and not seen a back specialist or neurosurgeon; said has no primary care doctor. Appointment pending.

## 2016-08-21 NOTE — Telephone Encounter (Signed)
No further response from patient or records received as of 08/21/16.

## 2023-05-29 ENCOUNTER — Ambulatory Visit: Payer: Self-pay | Admitting: Orthopaedic Surgery

## 2023-06-12 ENCOUNTER — Ambulatory Visit: Payer: Self-pay | Admitting: Orthopaedic Surgery

## 2023-06-17 ENCOUNTER — Telehealth: Payer: Self-pay | Admitting: Orthopaedic Surgery

## 2023-06-17 NOTE — Telephone Encounter (Signed)
 Returned the pt's call to rs his appt, lvm for him to cb

## 2023-06-26 ENCOUNTER — Ambulatory Visit (INDEPENDENT_AMBULATORY_CARE_PROVIDER_SITE_OTHER): Payer: Self-pay | Admitting: Orthopaedic Surgery

## 2023-06-26 ENCOUNTER — Encounter: Payer: Self-pay | Admitting: Orthopaedic Surgery

## 2023-06-26 VITALS — BP 120/84 | HR 85 | Ht 75.0 in | Wt 200.0 lb

## 2023-06-26 DIAGNOSIS — M51379 Other intervertebral disc degeneration, lumbosacral region without mention of lumbar back pain or lower extremity pain: Secondary | ICD-10-CM | POA: Insufficient documentation

## 2023-06-26 DIAGNOSIS — M79604 Pain in right leg: Secondary | ICD-10-CM

## 2023-06-26 DIAGNOSIS — M545 Low back pain, unspecified: Secondary | ICD-10-CM | POA: Diagnosis not present

## 2023-06-26 MED ORDER — TIZANIDINE HCL 4 MG PO TABS: ORAL_TABLET | ORAL | 3 refills | Status: DC

## 2023-06-26 MED ORDER — HYDROCODONE-ACETAMINOPHEN 7.5-325 MG PO TABS: 1.0000 | ORAL_TABLET | ORAL | 0 refills | Status: AC | PRN

## 2023-06-26 NOTE — Patient Instructions (Signed)
 MRI has been placed at [facility], if has not heard within week please call [phone]  Follow up in 2 weeks with Dr. Iline Mallory.

## 2023-06-26 NOTE — Progress Notes (Signed)
 Subjective:    Patient ID: Steve Gardner, male    DOB: December 04, 1970, 53 y.o.   MRN: [id number]  HPI He has had lower back pain for years getting worse after an injury at work in late May.  He was seen at [facility] ER on 06-01-23.  I have reviewed the notes and X-rays.  He has pain radiating down the right leg, past the knee, to the lateral right foot.  It has no improved.  He has been on prednisone  and Flexeril  with no help.  He is is pain most of the time.  He has pain at night.  It is getting no better.  He is concerned the pain continues.  He has been taking BC's.  He says he cannot take ibuprofen or Aleve.   Review of Systems  Constitutional:  Positive for activity change.  Musculoskeletal:  Positive for arthralgias and back pain.  All other systems reviewed and are negative. For Review of Systems, all other systems reviewed and are negative.  The following is a summary of the past history medically, past history surgically, known current medicines, social history and family history.  This information is gathered electronically by the computer from prior information and documentation.  I review this each visit and have found including this information at this point in the chart is beneficial and informative.   Past Medical History:  Diagnosis Date   GERD (gastroesophageal reflux disease)    has lpr   Strep throat    Tonsillitis     Past Surgical History:  Procedure Laterality Date   APPENDECTOMY     thumb surgery      Current Outpatient Medications on File Prior to Visit  Medication Sig Dispense Refill   amoxicillin  (AMOXIL ) 250 MG/5ML suspension Take 10 mLs (500 mg total) by mouth 3 (three) times daily. 210 mL 0   fluconazole  (DIFLUCAN ) 40 MG/ML suspension Take 5 mLs (200 mg total) by mouth daily. 35 mL 0   No current facility-administered medications on file prior to visit.    Social History   Socioeconomic History   Marital status: Married    Spouse name:  Not on file   Number of children: Not on file   Years of education: Not on file   Highest education level: Not on file  Occupational History   Not on file  Tobacco Use   Smoking status: Every Day    Current packs/day: 0.50    Average packs/day: 0.5 packs/day for 18.0 years (9.0 ttl pk-yrs)    Types: Cigarettes   Smokeless tobacco: Not on file  Substance and Sexual Activity   Alcohol use: No   Drug use: No   Sexual activity: Yes    Birth control/protection: None  Other Topics Concern   Not on file  Social History Narrative   Not on file   Social Drivers of Health   Financial Resource Strain: Not on file  Food Insecurity: Not on file  Transportation Needs: Not on file  Physical Activity: Not on file  Stress: Not on file  Social Connections: Not on file  Intimate Partner Violence: Not on file    History reviewed. No pertinent family history.  BP 120/84   Pulse 85   Ht 6' 3 (1.905 m)   Wt 200 lb (90.7 kg)   BMI 25.00 kg/m   Body mass index is 25 kg/m.      Objective:   Physical Exam Vitals and nursing note reviewed. Exam  conducted with a chaperone present.  Constitutional:      Appearance: He is well-developed.  HENT:     Head: Normocephalic and atraumatic.   Eyes:     Conjunctiva/sclera: Conjunctivae normal.     Pupils: Pupils are equal, round, and reactive to light.    Cardiovascular:     Rate and Rhythm: Normal rate and regular rhythm.  Pulmonary:     Effort: Pulmonary effort is normal.  Abdominal:     Palpations: Abdomen is soft.   Musculoskeletal:       Arms:     Cervical back: Normal range of motion and neck supple.   Skin:    General: Skin is warm and dry.   Neurological:     Mental Status: He is alert and oriented to person, place, and time.     Cranial Nerves: No cranial nerve deficit.     Motor: No abnormal muscle tone.     Coordination: Coordination normal.     Deep Tendon Reflexes: Reflexes are normal and symmetric. Reflexes  normal.   Psychiatric:        Behavior: Behavior normal.        Thought Content: Thought content normal.        Judgment: Judgment normal.    I have independently reviewed and interpreted x-rays of this patient done at another site by another physician or qualified health professional.        Assessment & Plan:   Encounter Diagnosis  Name Primary?   Lumbar pain with radiation down right leg Yes   I will give pain medicine and Zanaflex.  I have reviewed the [place]  Controlled Substance Reporting System web site prior to prescribing narcotic medicine for this patient.  Return in two weeks.  He needs a MRI of the lumbar spine.  He has had pain for some time and this is the latest episode.  He has no relief with treatment.  I am concerned about HNP.    Call if any problem.  Precautions discussed.  Electronically Signed Pleasant Brilliant, MD 6/18/202510:41 AM

## 2023-06-27 ENCOUNTER — Telehealth: Payer: Self-pay | Admitting: Orthopaedic Surgery

## 2023-06-27 NOTE — Telephone Encounter (Signed)
 Dr. Vicente Graham pt - spoke w/the pt, he is really confused about the MRI and he stated his insurance requires prior approval.  I have advised him to go ahead and call to schedule the MRI and someone here will work on the authorization.  [phone]

## 2023-06-27 NOTE — Telephone Encounter (Signed)
 Sw pt and he understands we have to go thru insurance to get authorization, but did tell him he can call the [facility] to schedule if chooses. Pt will wait for auth first.

## 2023-07-02 ENCOUNTER — Telehealth: Payer: Self-pay | Admitting: Orthopaedic Surgery

## 2023-07-02 NOTE — Telephone Encounter (Signed)
 [name] pt - pt lvm stating he has his MRI scheduled for 07/11/23 and the we need to call [facility] for a prior authorization.  He wants a call back at [phone]

## 2023-07-04 ENCOUNTER — Telehealth: Payer: Self-pay | Admitting: Orthopaedic Surgery

## 2023-07-04 NOTE — Telephone Encounter (Signed)
 Workin on this

## 2023-07-04 NOTE — Telephone Encounter (Signed)
 DR. BRENNA  Patient called and wants someone to call him back.  He said [facility] called him and said we (us ) need to get a MRI CODE.  He wants to know if he is still having his MRI.   Please call him back at [phone].

## 2023-07-05 ENCOUNTER — Telehealth: Payer: Self-pay | Admitting: Orthopaedic Surgery

## 2023-07-05 MED ORDER — HYDROCODONE-ACETAMINOPHEN 7.5-325 MG PO TABS: 1.0000 | ORAL_TABLET | Freq: Four times a day (QID) | ORAL | 0 refills | Status: DC | PRN

## 2023-07-05 NOTE — Telephone Encounter (Signed)
 DR. BRENNA    I have called the patient and left him a voicemail letting him know that his prescription has been called into his pharmacy.  Maybe he meant to say 5 days instead of 5 pills, he sounded very tired he just got off working a 12 hour shift.

## 2023-07-05 NOTE — Telephone Encounter (Signed)
 DR. ONESIMO  The patient called the [place] office lvm to about refilling his pain medicine. He said he was only given 5 pills and he is working in pain 12 hour shifts, and he doesn't know when he will be getting his MRI   I spoke with Sari about his MRI and she said it is still pending and to send a note to you in EPIC.

## 2023-07-05 NOTE — Telephone Encounter (Signed)
 Pt called requesting a refill of hydrocodone . [facility] Dr. Almeta phone number is [phone]. Pt is waiting for his MRI. Please forward this to [place] CMA

## 2023-07-05 NOTE — Telephone Encounter (Signed)
 Lvm on cell to return call. In regards to his MRI we are waiting on a determination. Clinicals has been sent over to his insurance.

## 2023-07-05 NOTE — Telephone Encounter (Signed)
 Pt insurance is pending authorization. Case # [id number]

## 2023-07-11 ENCOUNTER — Telehealth: Payer: Self-pay | Admitting: Orthopaedic Surgery

## 2023-07-11 ENCOUNTER — Ambulatory Visit (HOSPITAL_COMMUNITY)

## 2023-07-11 NOTE — Telephone Encounter (Signed)
 DR. BRENNA  Patient called again about his MRI. He is now working day shift and he called [employer] yesterday and they told him his MRI is still not approved so he called and canceled his appt.  [employer] also told him if he has the MRI done at [facility] in hospital it is going to cost him around $1300.00 and if he has it done at [facility] in [place] it will only cost him $438.00.  I told him it will still have to be approved for that facility we just can't schedule him an appt there.  I did transfer him to [employer] in [place].  Steve Gardner is in [place] with Dr. BRENNA she asked if you could please look into this.   Please call him back at [phone]

## 2023-07-16 MED ORDER — HYDROCODONE-ACETAMINOPHEN 7.5-325 MG PO TABS: 1.0000 | ORAL_TABLET | Freq: Four times a day (QID) | ORAL | 0 refills | Status: DC | PRN

## 2023-07-16 NOTE — Telephone Encounter (Signed)
 [name] pt - pt lvm late last night stating that we were supposed to be scheduling him an appointment for a MRI at [facility] and that he has left several messages and no one is calling him back.  He would like a call back w/an update on his MRI.  [phone]

## 2023-07-16 NOTE — Telephone Encounter (Signed)
 Pt is needing a refill hydrocodone  7.5-325mg , states he only got 27 pils last time when it should have been 30, he is taking them 6 times a day.  Trying to stretch on the meds so they will last until he gets the refill.

## 2023-07-16 NOTE — Telephone Encounter (Signed)
 I go into [facility] plan and look up case numbers [id number], [id number]. And all three case numbers are saying expired. I try to go back in and it tells me case has already been submitted and will need to close this one. Will try agan later

## 2023-07-16 NOTE — Telephone Encounter (Signed)
 Lvm for pt to return my call.

## 2023-07-16 NOTE — Telephone Encounter (Signed)
 See previous notes.

## 2023-07-17 NOTE — Telephone Encounter (Signed)
 Lvm for pt to return my call. I will address all the above from Dr. Brenna with pt once he calls back

## 2023-07-24 ENCOUNTER — Ambulatory Visit: Admitting: Orthopaedic Surgery

## 2023-07-24 ENCOUNTER — Encounter: Payer: Self-pay | Admitting: Orthopaedic Surgery

## 2023-07-24 ENCOUNTER — Telehealth: Payer: Self-pay | Admitting: Orthopaedic Surgery

## 2023-07-24 VITALS — BP 119/82 | HR 72

## 2023-07-24 DIAGNOSIS — M79604 Pain in right leg: Secondary | ICD-10-CM

## 2023-07-24 DIAGNOSIS — M545 Low back pain, unspecified: Secondary | ICD-10-CM

## 2023-07-24 MED ORDER — HYDROCODONE-ACETAMINOPHEN 7.5-325 MG PO TABS: 1.0000 | ORAL_TABLET | Freq: Four times a day (QID) | ORAL | 0 refills | Status: DC | PRN

## 2023-07-24 MED ORDER — PREDNISONE 5 MG (21) PO TBPK: ORAL_TABLET | ORAL | 0 refills | Status: DC

## 2023-07-24 NOTE — Progress Notes (Signed)
 My back is worse.  He was to have MRI of the lumbar spine but his insurance requires a non hospital setting. There has been some confusion about this.  We will set up MRI at [facility].  He is out of his pain medicine.  He would also like prednisone  dose pack. I will give both.  I have reviewed the [place]  Controlled Substance Reporting System web site prior to prescribing narcotic medicine for this patient.  Lower back is very tender, no spasm, ROM decreased, he is in pain. Muscle tone and strength normal, SLR positive right 25 degrees, limp right.  Encounter Diagnosis  Name Primary?   Lumbar pain with radiation down right leg Yes   Get the MRI.  Return in two weeks.  Call if any problem.  Precautions discussed.  Electronically Signed Lemond Stable, MD 7/16/202510:37 AM

## 2023-07-24 NOTE — Telephone Encounter (Signed)
 Patient called and want to know something about his MRI that you are working on he said. CB#[phone]

## 2023-07-24 NOTE — Telephone Encounter (Signed)
 Took care of this today while pt was in office

## 2023-07-30 ENCOUNTER — Telehealth: Payer: Self-pay | Admitting: Orthopaedic Surgery

## 2023-07-30 NOTE — Telephone Encounter (Signed)
 [name] pt - spoke w/the pt, he is requesting a refill for Hydrocodone  7.5-325 to be sent to [facility] in [place].

## 2023-07-30 NOTE — Telephone Encounter (Signed)
 [name] pt - spoke w/the pt this morning, he was returning Steve Gardner's call and would like for her to call him back [phone]

## 2023-07-30 NOTE — Telephone Encounter (Signed)
 Returned pt call explained to him the issue we are having with his insurance and how much of a challenge it is.  Told him I am going to see if can get his insurance to withdraw and try again. I will call contact pt to let him know what is going on.

## 2023-07-31 MED ORDER — HYDROCODONE-ACETAMINOPHEN 7.5-325 MG PO TABS: 1.0000 | ORAL_TABLET | Freq: Four times a day (QID) | ORAL | 0 refills | Status: DC | PRN

## 2023-08-06 NOTE — Telephone Encounter (Signed)
 Spoke with Steve Gardner with physical support and was advised I can restart a case starting tomorrow and to ask to speak to a nurse and tell them this case/procedure was not performed.and wont be used.

## 2023-08-07 ENCOUNTER — Encounter: Payer: Self-pay | Admitting: Orthopaedic Surgery

## 2023-08-07 ENCOUNTER — Ambulatory Visit: Admitting: Orthopaedic Surgery

## 2023-08-07 DIAGNOSIS — M79604 Pain in right leg: Secondary | ICD-10-CM | POA: Diagnosis not present

## 2023-08-07 DIAGNOSIS — M545 Low back pain, unspecified: Secondary | ICD-10-CM | POA: Diagnosis not present

## 2023-08-07 MED ORDER — HYDROCODONE-ACETAMINOPHEN 7.5-325 MG PO TABS: 1.0000 | ORAL_TABLET | Freq: Four times a day (QID) | ORAL | 0 refills | Status: DC | PRN

## 2023-08-07 NOTE — Progress Notes (Signed)
 I am hurting more.  He has been waiting to get MRI of the lumbar spine.  There has been insurance problems.  We are working on it and hopefully get it approved to be done in [place] at [facility].    He continues to have lower back pain.  He has paresthesias to the right to the foot.  He limps.  Lower back is tender, ROM is decreased, muscle tone and strength normal but SLR positive at 25 on right.    Encounter Diagnosis  Name Primary?   Lumbar pain with radiation down right leg Yes   Get the MRI.  I will reorder the pain medicine.  Return in two weeks.  Call if any problem.  Precautions discussed.  Electronically Signed Lemond Stable, MD 7/30/20259:05 AM

## 2023-08-07 NOTE — Addendum Note (Signed)
 Addended by: DARRA MILLING R on: 08/07/2023 01:51 PM   Modules accepted: Orders

## 2023-08-13 ENCOUNTER — Ambulatory Visit (HOSPITAL_COMMUNITY)

## 2023-08-14 ENCOUNTER — Telehealth: Payer: Self-pay | Admitting: Orthopaedic Surgery

## 2023-08-14 MED ORDER — HYDROCODONE-ACETAMINOPHEN 7.5-325 MG PO TABS: 1.0000 | ORAL_TABLET | Freq: Four times a day (QID) | ORAL | 0 refills | Status: AC | PRN

## 2023-08-14 NOTE — Telephone Encounter (Signed)
 [name] pt - spoke w/the pt, he is requesting a refill for Hydrocodone  7.5-[facility]

## 2023-08-16 ENCOUNTER — Telehealth: Payer: Self-pay | Admitting: Orthopaedic Surgery

## 2023-08-16 NOTE — Telephone Encounter (Signed)
 [name] pt - Mliss w/[facility] [phone] lvm stated they have an autho for the MRI for this pt, but the location was [facility], the insurance company has changed the location to [facility], but that has opened another case.  She is requesting a call back.  Fax [phone]

## 2023-08-19 ENCOUNTER — Telehealth: Payer: Self-pay | Admitting: Orthopaedic Surgery

## 2023-08-19 NOTE — Telephone Encounter (Signed)
 This pt lvm for Samule stating he had a MRI scheduled for today at 10am w/[facility] and they called him Fri evening and stated they needed to cx bc it's still pending.  He stated they told him to give you NPI [id number].  He would like a call back.

## 2023-08-20 ENCOUNTER — Telehealth: Payer: Self-pay | Admitting: Orthopaedic Surgery

## 2023-08-20 NOTE — Telephone Encounter (Signed)
 [name] pt - pt lvm at 7:30am this morning, wanting to speak to Samule about his MRI.  [phone]

## 2023-08-26 ENCOUNTER — Telehealth: Payer: Self-pay | Admitting: *Deleted

## 2023-08-26 NOTE — Telephone Encounter (Signed)
 See today phone note- I called and left message

## 2023-08-26 NOTE — Telephone Encounter (Signed)
 [employer] case number [id number] spoke with [employer]

## 2023-08-26 NOTE — Telephone Encounter (Signed)
See today's phone note.  

## 2023-08-26 NOTE — Telephone Encounter (Signed)
 I called directly to [facility] to get pt PA for MRI lumbar spine to see if can try to get this MRI approved. Now they are telling me they are needing clinicals stating pt is needing 6 week provider treatment with xray of his lumbar spine faxed to [phone] .   I called and left message with pt to return my call in regarding the MRI.

## 2023-08-28 ENCOUNTER — Ambulatory Visit: Admitting: Orthopaedic Surgery

## 2023-08-28 ENCOUNTER — Encounter: Payer: Self-pay | Admitting: Orthopaedic Surgery

## 2023-08-28 DIAGNOSIS — M545 Low back pain, unspecified: Secondary | ICD-10-CM | POA: Diagnosis not present

## 2023-08-28 DIAGNOSIS — M79604 Pain in right leg: Secondary | ICD-10-CM

## 2023-08-28 MED ORDER — HYDROCODONE-ACETAMINOPHEN 7.5-325 MG PO TABS: 1.0000 | ORAL_TABLET | Freq: Four times a day (QID) | ORAL | 0 refills | Status: DC | PRN

## 2023-08-28 NOTE — Progress Notes (Signed)
 I could not get the MRI.  He could not get the MRI as the place of service was sent in incorrectly  He wants the procedure done in [place].  I will get this arranged.  He is in more pain.  I will renew pain medicine. I will keep him out of work next two days.  Lower back is very painful diffusely, ROM decreased, SLR positive on the right at 25 degrees, muscle tone and strength normal, limp to the right, pain runs from the back to the right foot, NV intact.  Encounter Diagnosis  Name Primary?   Lumbar pain with radiation down right leg Yes   Get the MRI.  Return in two weeks or earlier if get the MRI early.  Call if any problem.  Precautions discussed.  Electronically Signed Lemond Stable, MD 8/20/20259:51 AM

## 2023-08-28 NOTE — Patient Instructions (Signed)
 Work note to be out [employer] and tomorrow night.

## 2023-08-30 ENCOUNTER — Encounter: Payer: Self-pay | Admitting: Radiology

## 2023-08-30 NOTE — Telephone Encounter (Signed)
 Pt has now been approved to have MRI done at [facility]. Pending call back from pt to see if he would like to call to schedule that is convenient for him or if he would like for me to call and schedule

## 2023-08-30 NOTE — Telephone Encounter (Signed)
 Pt has appt today at [facility] at 12pm pt called to let me know

## 2023-08-30 NOTE — Telephone Encounter (Signed)
 This has been approved with auth # [id number] valid 08/28/23-10/12/23

## 2023-09-11 ENCOUNTER — Encounter: Payer: Self-pay | Admitting: Orthopaedic Surgery

## 2023-09-11 ENCOUNTER — Ambulatory Visit: Admitting: Orthopaedic Surgery

## 2023-09-11 DIAGNOSIS — M79604 Pain in right leg: Secondary | ICD-10-CM

## 2023-09-11 DIAGNOSIS — M545 Low back pain, unspecified: Secondary | ICD-10-CM | POA: Diagnosis not present

## 2023-09-11 MED ORDER — HYDROCODONE-ACETAMINOPHEN 7.5-325 MG PO TABS: 1.0000 | ORAL_TABLET | Freq: Four times a day (QID) | ORAL | 0 refills | Status: DC | PRN

## 2023-09-11 NOTE — Progress Notes (Signed)
 I still hurt.  He had MRI in [place] showing: Right paracentral disc extrusion, grade 1 retrolisthesis and degenerative changes at L3-4 causing mild spinal canal stenosis with mild left and moderate right neuroforaminal narrowing.  Moderate bilateral neuroforaminal narrowing at L5-S1.  Moderate left and mild right neuroforaminal narrowing at L4-5.  Active Schmorl's node formation along the superior endplate of L4 could be a pain generator.  I have explained the findings to him.  I will have him see Dr. Eldonna for epidural injection.  I will renew pain medicine.  He has pain in the lower back with pain down the right leg.  NV intact.  ROM painful.  Slight limp right.  Muscle tone and strength normal.  Encounter Diagnosis  Name Primary?   Lumbar pain with radiation down right leg Yes   Out of work until see by Dr. Eldonna.  Continue out of work if needed until I see him in one month.  Return in one month.  Call if any problem.  Precautions discussed.  Electronically Signed Lemond Stable, MD 9/3/20259:32 AM

## 2023-09-11 NOTE — Patient Instructions (Addendum)
 OOW note until seen with Dr. Eldonna.   May continue OOW until I see him in one month if needed.

## 2023-09-23 ENCOUNTER — Telehealth: Payer: Self-pay | Admitting: Orthopaedic Surgery

## 2023-09-23 NOTE — Telephone Encounter (Signed)
 Received vm from patient regarding forms. IC,lmvm advising that I sent him a mychart message on 9/10 @ 4:03 informing him that we a signed authorization and $20 form fee payment before we can complete form. Advised on vm to go by [name] office to complete and sign auth for [employer] [place]  [employer] and pay $20.00 form fee. Once received, forms can be completed.

## 2023-09-23 NOTE — Telephone Encounter (Signed)
 Spoke w/this patient regarding the fee and release form for his forms, he doesn't want to follow protocol and doesn't seem to understand.  He said he lvm for you today.  I've tried and got nowhere, when you get a sec, will  you contact him and try to explain?  I saw he got a message in [place].  He's not very kind, fyi.  [phone]

## 2023-09-23 NOTE — Telephone Encounter (Signed)
 Patient was advised that he needs to go by [name] office to pay the form fee and complete and sign auth for [employer] [place]  [employer] or the forms will not be completed per [facility]. He voiced understanding and stated he will go by [name] office to complete auth and pay the form fee. Thank you so much! If you can, let me know when received and I will complete his forms.

## 2023-09-24 ENCOUNTER — Telehealth: Payer: Self-pay | Admitting: Orthopaedic Surgery

## 2023-09-24 NOTE — Telephone Encounter (Signed)
 Dr. Vicente Graham pt - pt lvm requesting a refill for Hydrocodone  [phone]

## 2023-09-25 ENCOUNTER — Telehealth: Payer: Self-pay | Admitting: Orthopaedic Surgery

## 2023-09-25 MED ORDER — HYDROCODONE-ACETAMINOPHEN 7.5-325 MG PO TABS: 1.0000 | ORAL_TABLET | Freq: Four times a day (QID) | ORAL | 0 refills | Status: DC | PRN

## 2023-09-25 NOTE — Telephone Encounter (Signed)
 Re-faxed [employer] [place]  [employer] form with notes [phone]. Originally faxed 09/15.

## 2023-10-07 ENCOUNTER — Ambulatory Visit: Admitting: Physical Medicine and Rehabilitation

## 2023-10-07 ENCOUNTER — Other Ambulatory Visit: Payer: Self-pay

## 2023-10-07 DIAGNOSIS — M5416 Radiculopathy, lumbar region: Secondary | ICD-10-CM | POA: Diagnosis not present

## 2023-10-07 MED ORDER — METHYLPREDNISOLONE ACETATE 80 MG/ML IJ SUSP
40.0000 mg | Freq: Once | INTRAMUSCULAR | Status: AC
  Administered 2023-10-07: 40 mg

## 2023-10-07 NOTE — Procedures (Signed)
 Lumbar Epidural Steroid Injection - Interlaminar Approach with Fluoroscopic Guidance  Patient: Steve Gardner      Date of Birth: 28-Aug-1970 MRN: [id number] PCP: Patient, No Pcp Per      Visit Date: 10/07/2023   Universal Protocol:     Consent Given By: the patient  Position: PRONE  Additional Comments: Vital signs were monitored before and after the procedure. Patient was prepped and draped in the usual sterile fashion. The correct patient, procedure, and site was verified.   Injection Procedure Details:   Procedure diagnoses: Lumbar radiculopathy [M54.16]   Meds Administered:  Meds ordered this encounter  Medications   methylPREDNISolone acetate (DEPO-MEDROL) injection 40 mg     Laterality: Right  Location/Site:  L4-5  Needle: 3.5 in., 20 ga. Tuohy  Needle Placement: Paramedian epidural  Findings:   -Comments: Excellent flow of contrast into the epidural space.  Procedure Details: Using a paramedian approach from the side mentioned above, the region overlying the inferior lamina was localized under fluoroscopic visualization and the soft tissues overlying this structure were infiltrated with 4 ml. of 1% Lidocaine without Epinephrine. The Tuohy needle was inserted into the epidural space using a paramedian approach.   The epidural space was localized using loss of resistance along with counter oblique bi-planar fluoroscopic views.  After negative aspirate for air, blood, and CSF, a 2 ml. volume of Isovue-250 was injected into the epidural space and the flow of contrast was observed. Radiographs were obtained for documentation purposes.    The injectate was administered into the level noted above.   Additional Comments:  The patient tolerated the procedure well Dressing: 2 x 2 sterile gauze and Band-Aid    Post-procedure details: Patient was observed during the procedure. Post-procedure instructions were reviewed.  Patient left the clinic in stable condition.

## 2023-10-07 NOTE — Progress Notes (Signed)
 Pain Scale   Average Pain 7 Patient advising he has lower back pain radiating to right leg, patient states his pain increases with movement and bending and decreases with rest and pain medication        +Driver, -BT, -Dye Allergies.

## 2023-10-07 NOTE — Progress Notes (Signed)
 Steve Gardner - 53 y.o. male MRN [id number]  Date of birth: 08/17/1970  Office Visit Note: Visit Date: 10/07/2023 PCP: Patient, No Pcp Per Referred by: Brenna Lin, MD  Subjective: Chief Complaint  Patient presents with   Lower Back - Pain   HPI:  Steve Gardner is a 53 y.o. male who comes in today at the request of Dr. Lin Brenna for planned Right L4-5 Lumbar Interlaminar epidural steroid injection with fluoroscopic guidance.  The patient has failed conservative care including home exercise, medications, time and activity modification.  This injection will be diagnostic and hopefully therapeutic.  Please see requesting physician notes for further details and justification.   ROS Otherwise per HPI.  Assessment & Plan: Visit Diagnoses:    ICD-10-CM   1. Lumbar radiculopathy  M54.16 XR C-ARM NO REPORT    Epidural Steroid injection    methylPREDNISolone acetate (DEPO-MEDROL) injection 40 mg      Plan: No additional findings.   Meds & Orders:  Meds ordered this encounter  Medications   methylPREDNISolone acetate (DEPO-MEDROL) injection 40 mg    Orders Placed This Encounter  Procedures   XR C-ARM NO REPORT   Epidural Steroid injection    Follow-up: Return for visit to requesting provider as needed.   Procedures: No procedures performed  Lumbar Epidural Steroid Injection - Interlaminar Approach with Fluoroscopic Guidance  Patient: Steve Gardner      Date of Birth: Apr 18, 1970 MRN: [id number] PCP: Patient, No Pcp Per      Visit Date: 10/07/2023   Universal Protocol:     Consent Given By: the patient  Position: PRONE  Additional Comments: Vital signs were monitored before and after the procedure. Patient was prepped and draped in the usual sterile fashion. The correct patient, procedure, and site was verified.   Injection Procedure Details:   Procedure diagnoses: Lumbar radiculopathy [M54.16]   Meds Administered:  Meds ordered this encounter  Medications    methylPREDNISolone acetate (DEPO-MEDROL) injection 40 mg     Laterality: Right  Location/Site:  L4-5  Needle: 3.5 in., 20 ga. Tuohy  Needle Placement: Paramedian epidural  Findings:   -Comments: Excellent flow of contrast into the epidural space.  Procedure Details: Using a paramedian approach from the side mentioned above, the region overlying the inferior lamina was localized under fluoroscopic visualization and the soft tissues overlying this structure were infiltrated with 4 ml. of 1% Lidocaine without Epinephrine. The Tuohy needle was inserted into the epidural space using a paramedian approach.   The epidural space was localized using loss of resistance along with counter oblique bi-planar fluoroscopic views.  After negative aspirate for air, blood, and CSF, a 2 ml. volume of Isovue-250 was injected into the epidural space and the flow of contrast was observed. Radiographs were obtained for documentation purposes.    The injectate was administered into the level noted above.   Additional Comments:  The patient tolerated the procedure well Dressing: 2 x 2 sterile gauze and Band-Aid    Post-procedure details: Patient was observed during the procedure. Post-procedure instructions were reviewed.  Patient left the clinic in stable condition.   Clinical History: He had MRI in [place] showing: Right paracentral disc extrusion, grade 1 retrolisthesis and degenerative changes at L3-4 causing mild spinal canal stenosis with mild left and moderate right neuroforaminal narrowing.  Moderate bilateral neuroforaminal narrowing at L5-S1.  Moderate left and mild right neuroforaminal narrowing at L4-5.  Active Schmorl's node formation along the superior endplate of L4  could be a pain generator.     Objective:  VS:  HT:    WT:   BMI:     BP:   HR: bpm  TEMP: ( )  RESP:  Physical Exam Vitals and nursing note reviewed.  Constitutional:      General: He is not in acute  distress.    Appearance: Normal appearance. He is not ill-appearing.  HENT:     Head: Normocephalic and atraumatic.     Right Ear: External ear normal.     Left Ear: External ear normal.     Nose: No congestion.  Eyes:     Extraocular Movements: Extraocular movements intact.  Cardiovascular:     Rate and Rhythm: Normal rate.     Pulses: Normal pulses.  Pulmonary:     Effort: Pulmonary effort is normal. No respiratory distress.  Abdominal:     General: There is no distension.     Palpations: Abdomen is soft.  Musculoskeletal:        General: No tenderness or signs of injury.     Cervical back: Neck supple.     Right lower leg: No edema.     Left lower leg: No edema.     Comments: Patient has good distal strength without clonus.  Skin:    Findings: No erythema or rash.  Neurological:     General: No focal deficit present.     Mental Status: He is alert and oriented to person, place, and time.     Sensory: No sensory deficit.     Motor: No weakness or abnormal muscle tone.     Coordination: Coordination normal.  Psychiatric:        Mood and Affect: Mood normal.        Behavior: Behavior normal.      Imaging: No results found.

## 2023-10-08 ENCOUNTER — Encounter: Admitting: Physical Medicine and Rehabilitation

## 2023-10-09 ENCOUNTER — Ambulatory Visit: Admitting: Orthopaedic Surgery

## 2023-10-09 ENCOUNTER — Encounter: Payer: Self-pay | Admitting: Orthopaedic Surgery

## 2023-10-09 DIAGNOSIS — M5416 Radiculopathy, lumbar region: Secondary | ICD-10-CM | POA: Diagnosis not present

## 2023-10-09 DIAGNOSIS — M79604 Pain in right leg: Secondary | ICD-10-CM | POA: Diagnosis not present

## 2023-10-09 DIAGNOSIS — M545 Low back pain, unspecified: Secondary | ICD-10-CM | POA: Diagnosis not present

## 2023-10-09 MED ORDER — HYDROCODONE-ACETAMINOPHEN 7.5-325 MG PO TABS: 1.0000 | ORAL_TABLET | Freq: Four times a day (QID) | ORAL | 0 refills | Status: DC | PRN

## 2023-10-09 NOTE — Progress Notes (Signed)
 I still hurt.  The epidural by Dr. Eldonna did not help relieve his lower back pain and right sided paresthesia.  He has pain most of the time.  He is hurting.  I have reviewed Dr. Lyda notes.  His lower back is very tender on the right lower but he has no spasm. Gait is a limp to the right.  NV Intact. ROM decreased.  Muscle tone and strength are normal.  I have filled out forms for disability/ FLA.  I am retiring tomorrow.  I will have him seen by Dr. Georgina at the [facility] office.  He is agreeable to this.  Encounter Diagnoses  Name Primary?   Lumbar radiculopathy Yes   Lumbar pain with radiation down right leg    To Dr. Georgina.  Out of work until sees Dr. Georgina.  I have reviewed the [place]  Controlled Substance Reporting System web site prior to prescribing narcotic medicine for this patient.  Call if any problem.  Precautions discussed.  Electronically Signed Lemond Stable, MD 10/1/20258:52 AM

## 2023-10-09 NOTE — Patient Instructions (Signed)
 Needs work note to be out of work until pt follows up with back doctor, Dr. Georgina.

## 2023-10-17 ENCOUNTER — Telehealth: Payer: Self-pay

## 2023-10-17 NOTE — Telephone Encounter (Signed)
 Patient is working on his disability and stated he needed his medical records. I called back and had to leave a message to call the office

## 2023-11-07 ENCOUNTER — Other Ambulatory Visit (INDEPENDENT_AMBULATORY_CARE_PROVIDER_SITE_OTHER): Payer: Self-pay

## 2023-11-07 ENCOUNTER — Ambulatory Visit: Admitting: Orthopedic Surgery

## 2023-11-07 VITALS — BP 136/87 | Ht 74.0 in | Wt 200.0 lb

## 2023-11-07 DIAGNOSIS — M5416 Radiculopathy, lumbar region: Secondary | ICD-10-CM

## 2023-11-07 DIAGNOSIS — M79604 Pain in right leg: Secondary | ICD-10-CM | POA: Diagnosis not present

## 2023-11-07 DIAGNOSIS — M545 Low back pain, unspecified: Secondary | ICD-10-CM

## 2023-11-07 MED ORDER — HYDROCODONE-ACETAMINOPHEN 5-325 MG PO TABS: 1.0000 | ORAL_TABLET | Freq: Four times a day (QID) | ORAL | 0 refills | Status: AC | PRN

## 2023-11-07 NOTE — Progress Notes (Signed)
 Orthopedic Spine Surgery Office Note  Assessment: Patient is a 53 y.o. male with chronic, progressive low back pain   Plan: -Explained that initially conservative treatment is tried as a significant number of patients may experience relief with these treatment modalities. Discussed that the conservative treatments include:  -activity modification  -physical therapy  -over the counter pain medications  -medrol dosepak  -lumbar steroid injections -Patient has tried PT, tylenol , NSAIDs, prednisone , lumbar ESI, narcotics -Patient not interested in any kind of surgical option as he is the sole income for his household and needs to get back to work. Felt Norco that Dr. Brenna was prescribing was helpful. Prescribed Norco and referred him to pain management.  -Would need to be nicotine free prior to any elective spine surgery -Patient should return to office on an as needed basis   Patient expressed understanding of the plan and all questions were answered to the patient's satisfaction.   ___________________________________________________________________________   History:  Patient is a 53 y.o. male who presents today for lumbar spine. Patient has had several years of low back pain. It has gotten progressively worse with time. He works a physical job and feels that makes it worse. He is not willing to switch his job as this is the only line of work he knows. He sometimes gets pain radiating into his leg but it is not consistent and is nothing compared to his back pain. He had been previously seeing Dr. Brenna who had been prescribing him narcotics to control the pain. No weakness in the legs. No bowel or bladder symptoms. No saddle anesthesia.   Treatments tried: PT, tylenol , NSAIDs, prednisone , lumbar ESI, narcotics  Review of systems: Denies fevers and chills, night sweats, unexplained weight loss, history of cancer. Has had pain that wakes him at night  Past medical  history: GERD Chronic pain  Allergies: NKDA  Past surgical history:  Appendectomy Thumb surgery  Social history: Reports use of nicotine product (smoking, vaping, patches, smokeless) Alcohol use: denies Denies recreational drug use   Physical Exam:  BMI of 25.7  General: no acute distress, appears stated age Neurologic: alert, answering questions appropriately, following commands Respiratory: unlabored breathing on room air, symmetric chest rise Psychiatric: appropriate affect, normal cadence to speech   MSK (spine):  -Strength exam      Left  Right EHL    5/5  5/5 TA    5/5  5/5 GSC    5/5  5/5 Knee extension  5/5  5/5 Hip flexion   5/5  5/5  -Sensory exam    Sensation intact to light touch in L3-S1 nerve distributions of bilateral lower extremities  -Achilles DTR: 2/4 on the left, 2/4 on the right -Patellar tendon DTR: 2/4 on the left, 2/4 on the right  -Straight leg raise: negative bilaterally -Clonus: no beats bilaterally  -Left hip exam: no pain through range of motion  -Right hip exam: no pain through range of motion  Imaging: XRs of the lumbar spine from 11/07/2023 were independently reviewed and interpreted, showing disc height loss at L3/4 and L5/S1. Small anterior osteophyte formation at L5/S1. No fracture or dislocation seen. No evidence of instability on flexion/extension views.   Has had a recent MRI. He did not bring the images and I cannot view them but there was no report of any osseous lesions an dno significant central stenosis.    Patient name: Steve Gardner Patient MRN: [id number] Date of visit: 11/07/23

## 2023-11-11 ENCOUNTER — Encounter: Payer: Self-pay | Admitting: Radiology

## 2023-11-12 ENCOUNTER — Telehealth: Payer: Self-pay | Admitting: Orthopedic Surgery

## 2023-11-12 NOTE — Telephone Encounter (Signed)
 [employer] [place]  [employer] form refaxed [phone], originally faxed 11/07/23

## 2023-11-13 ENCOUNTER — Telehealth: Payer: Self-pay | Admitting: Orthopedic Surgery

## 2023-11-13 NOTE — Telephone Encounter (Signed)
 Pt called requesting a call back concerning his pain meds. Pt states he need stronger dosage of his pain meds until he see pain management. Please call pt at [phone]. He asked leave detailed message if he is unabke to answer pt work 12 shift and may be sleep.

## 2023-11-14 MED ORDER — HYDROCODONE-ACETAMINOPHEN 5-325 MG PO TABS
1.0000 | ORAL_TABLET | Freq: Four times a day (QID) | ORAL | 0 refills | Status: AC | PRN
Start: 2023-11-14 — End: 2023-11-19

## 2023-11-14 NOTE — Telephone Encounter (Signed)
 I called and advised patient of Dr. Jeraline message and he understands, I did give him the phone number to [facility] [phone].   **He is asking if Dr. Georgina would refill the pain meds that he has prescribed till he can get in with them?

## 2023-11-27 ENCOUNTER — Encounter: Payer: Self-pay | Admitting: Physical Medicine & Rehabilitation

## 2023-12-11 ENCOUNTER — Telehealth: Payer: Self-pay | Admitting: Orthopedic Surgery

## 2023-12-11 MED ORDER — HYDROCODONE-ACETAMINOPHEN 5-325 MG PO TABS: 1.0000 | ORAL_TABLET | Freq: Four times a day (QID) | ORAL | 0 refills | Status: AC | PRN

## 2023-12-11 NOTE — Telephone Encounter (Signed)
 Patient called and ask if you could refill his medication until he goes to pain management because he is in so much pain. CB#[phone]

## 2023-12-11 NOTE — Addendum Note (Signed)
 Addended by: GEORGINA SHARPER on: 12/11/2023 10:20 AM   Modules accepted: Orders

## 2023-12-27 ENCOUNTER — Telehealth: Payer: Self-pay | Admitting: Orthopedic Surgery

## 2023-12-27 NOTE — Telephone Encounter (Signed)
 I called and lmom advising pt of Dr. Jeraline Message

## 2023-12-27 NOTE — Telephone Encounter (Signed)
 Pt called and ask if you can refill the pain medication. CB#[phone]

## 2023-12-31 ENCOUNTER — Telehealth: Payer: Self-pay | Admitting: *Deleted

## 2023-12-31 NOTE — Telephone Encounter (Signed)
 Steve Gardner contacted our office requesting pain medication. Please note that he is not scheduled as a new patient until January 17, 2024.  Our office does not prescribe pain medication at the first visit. As outlined in our intake paperwork, once a patient is accepted for evaluation, they are expected to continue receiving pain medication from their previous provider until our provider has completed the initial evaluation and drug screen results have been reviewed. Only after this process is complete will prescribing be considered.  Thank you for your understanding.

## 2024-01-17 ENCOUNTER — Encounter: Admitting: Physical Medicine & Rehabilitation
